# Patient Record
Sex: Female | Born: 1960 | Hispanic: No | Marital: Married | Smoking: Never smoker
Health system: Southern US, Community
[De-identification: ages and names within clinical notes are randomized; demographics above are authoritative.]

## PROBLEM LIST (undated history)

## (undated) DIAGNOSIS — R42 Dizziness and giddiness: Secondary | ICD-10-CM

## (undated) DIAGNOSIS — G43909 Migraine, unspecified, not intractable, without status migrainosus: Secondary | ICD-10-CM

## (undated) HISTORY — PX: COLONOSCOPY: SHX174

## (undated) HISTORY — DX: Dizziness and giddiness: R42

## (undated) HISTORY — DX: Migraine, unspecified, not intractable, without status migrainosus: G43.909

---

## 2000-02-24 ENCOUNTER — Other Ambulatory Visit: Admission: RE | Admit: 2000-02-24 | Discharge: 2000-02-24 | Payer: Self-pay | Admitting: Gynecology

## 2001-02-28 ENCOUNTER — Other Ambulatory Visit: Admission: RE | Admit: 2001-02-28 | Discharge: 2001-02-28 | Payer: Self-pay | Admitting: Gynecology

## 2002-02-28 ENCOUNTER — Other Ambulatory Visit: Admission: RE | Admit: 2002-02-28 | Discharge: 2002-02-28 | Payer: Self-pay | Admitting: Gynecology

## 2003-03-16 ENCOUNTER — Other Ambulatory Visit: Admission: RE | Admit: 2003-03-16 | Discharge: 2003-03-16 | Payer: Self-pay | Admitting: Gynecology

## 2004-03-16 ENCOUNTER — Other Ambulatory Visit: Admission: RE | Admit: 2004-03-16 | Discharge: 2004-03-16 | Payer: Self-pay | Admitting: Gynecology

## 2005-04-14 ENCOUNTER — Other Ambulatory Visit: Admission: RE | Admit: 2005-04-14 | Discharge: 2005-04-14 | Payer: Self-pay | Admitting: Gynecology

## 2006-05-21 ENCOUNTER — Other Ambulatory Visit: Admission: RE | Admit: 2006-05-21 | Discharge: 2006-05-21 | Payer: Self-pay | Admitting: Gynecology

## 2006-06-20 ENCOUNTER — Emergency Department: Payer: Self-pay | Admitting: Emergency Medicine

## 2007-05-31 ENCOUNTER — Other Ambulatory Visit: Admission: RE | Admit: 2007-05-31 | Discharge: 2007-05-31 | Payer: Self-pay | Admitting: Gynecology

## 2009-02-05 ENCOUNTER — Other Ambulatory Visit: Admission: RE | Admit: 2009-02-05 | Discharge: 2009-02-05 | Payer: Self-pay | Admitting: Gynecology

## 2009-02-05 ENCOUNTER — Ambulatory Visit: Payer: Self-pay | Admitting: Women's Health

## 2009-02-05 ENCOUNTER — Encounter: Payer: Self-pay | Admitting: Women's Health

## 2010-05-02 ENCOUNTER — Ambulatory Visit
Admission: RE | Admit: 2010-05-02 | Discharge: 2010-05-02 | Payer: Self-pay | Source: Home / Self Care | Attending: Women's Health | Admitting: Women's Health

## 2010-05-02 ENCOUNTER — Other Ambulatory Visit: Payer: Self-pay | Admitting: Women's Health

## 2010-05-02 ENCOUNTER — Other Ambulatory Visit
Admission: RE | Admit: 2010-05-02 | Discharge: 2010-05-02 | Payer: Self-pay | Source: Home / Self Care | Admitting: Gynecology

## 2010-11-15 ENCOUNTER — Encounter: Payer: Self-pay | Admitting: Anesthesiology

## 2010-11-16 ENCOUNTER — Ambulatory Visit (INDEPENDENT_AMBULATORY_CARE_PROVIDER_SITE_OTHER): Payer: BC Managed Care – PPO | Admitting: Women's Health

## 2010-11-16 ENCOUNTER — Encounter: Payer: Self-pay | Admitting: Women's Health

## 2010-11-16 VITALS — BP 130/70

## 2010-11-16 DIAGNOSIS — N898 Other specified noninflammatory disorders of vagina: Secondary | ICD-10-CM

## 2010-11-16 DIAGNOSIS — R300 Dysuria: Secondary | ICD-10-CM

## 2010-11-16 DIAGNOSIS — F411 Generalized anxiety disorder: Secondary | ICD-10-CM

## 2010-11-16 DIAGNOSIS — R82998 Other abnormal findings in urine: Secondary | ICD-10-CM

## 2010-11-16 DIAGNOSIS — F419 Anxiety disorder, unspecified: Secondary | ICD-10-CM

## 2010-11-16 DIAGNOSIS — Z01419 Encounter for gynecological examination (general) (routine) without abnormal findings: Secondary | ICD-10-CM

## 2010-11-16 DIAGNOSIS — Z13220 Encounter for screening for lipoid disorders: Secondary | ICD-10-CM

## 2010-11-16 DIAGNOSIS — Z309 Encounter for contraceptive management, unspecified: Secondary | ICD-10-CM

## 2010-11-16 MED ORDER — ETONOGESTREL-ETHINYL ESTRADIOL 0.12-0.015 MG/24HR VA RING: VAGINAL_RING | VAGINAL | Status: DC

## 2010-11-16 MED ORDER — ALPRAZOLAM 0.5 MG PO TABS: 0.5000 mg | ORAL_TABLET | Freq: Every evening | ORAL | Status: AC | PRN

## 2010-11-16 MED ORDER — FLUCONAZOLE 150 MG PO TABS: 150.0000 mg | ORAL_TABLET | Freq: Once | ORAL | Status: DC

## 2010-11-16 MED ORDER — SERTRALINE HCL 50 MG PO TABS: 50.0000 mg | ORAL_TABLET | Freq: Every day | ORAL | Status: DC

## 2010-11-16 NOTE — Progress Notes (Deleted)
Alexandria Wheeler 12/01/1960 [id number]    History:    The patient presents for annual exam.  50 yo MWF G0 contraceptives, NuvaRing continuously, having no cycle. Has had some increased vaginal irritation and slight burning with urination at the initiation of the stream. Has a history of migraines, has seen a headache specialist and she is currently on Topamax, treximet per neurologist. Has a history of anxiety and  depression in2004. States has done well since then, but has had increased anxiety and depression due to situational stressors. Had been on Zoloft at that time and had good relief of symptoms. And would like to go back on Zoloft. Denies any feelings of harming self.   Past medical history, past surgical history, family history and social history were all reviewed and documented in the EPIC chart.   ROS:  A 14 point ROS was performed and pertinent positives and negatives are included in the history.  Exam:  Filed Vitals:   11/16/10 1049  BP: 130/70    General appearance:  Normal Head/Neck:  Normal, without cervical or supraclavicular adenopathy. Thyroid:  Symmetrical, normal in size, without palpable masses or nodularity. Respiratory  Effort:  Normal  Auscultation:  Clear without wheezing or rhonchi Cardiovascular  Auscultation:  Regular rate, without rubs, murmurs or gallops  Edema/varicosities:  Not grossly evident Abdominal  Masses/tenderness:  Soft,nontender, without masses, guarding or rebound.  Liver/spleen:  No organomegaly noted  Hernia:  None appreciated  Occult test:   Skin  Inspection:  Grossly normal  Palpation:  Grossly normal Neurologic/psychiatric  Orientation:  Normal with appropriate conversation.  Mood/affect:  Normal  Genitourinary    Breasts: Examined lying and sitting.     Right: Without masses, retractions, discharge or axillary adenopathy.     Left: Without masses, retractions, discharge or axillary adenopathy.   Inguinal/mons:  Normal without  inguinal adenopathy  External genitalia:  Normal  BUS/Urethra/Skene's glands:  Normal  Bladder:  Normal  Vagina:  Normal  Cervix:  Normal  Uterus:   normal in size, shape and contour.  Midline and mobile  Adnexa/parametria:     Rt: Without masses or tenderness.   Lt: Without masses or tenderness.  Anus and perineum: Normal  Digital rectal exam: Normal sphincter tone without palpated masses or tenderness  Assessment/Plan:  50 y.o. year old female for annual exam.  Contraceptive with NuvaRing, uses continuously and has been cycle free for approximately one year. Prescription proper use was given and reviewed, reviewed slight risk for blood clots and strokes and accepts. Will check a CBC, lipid profile, UA, and Pap. SBE's encouraged, she does get a yearly mammogram which have been normal also. Encouraged exercise, vitamin D 2000 daily encouraged. Will start back on Zoloft 50 mg, start with a half a tablet for several weeks increased to a full tablet. Strongly encouraged to return to counseling. She does live in [place] and she has seen counselor there. She had a normal colonoscopy in 2010, does have a history of a negative polyp in1995. Does use occasional Xanax 0.5 did review addictive properties, prescription was given.    Harrington Challenger MD, 12:15 PM 11/16/2010

## 2010-11-16 NOTE — Progress Notes (Signed)
  Presents with several complaints. States has had some increase in vaginal discharge, vaginal burning, and burning at initiation of stream of urination. Has had some increased situational stressors. Increased stress at work, been more tearful  lately, has had a history of anxiety in 2004. Has used Zoloft in the past with good relief but states has not needed  since 2004. Does take an occasional Xanax 0.5. Denies any feelings of harming herself, but states she feels that Zoloft would be helpful to take again. Encourage counseling she has sought counseling in the past and will call to schedule. She does live in [place] and she will get that scheduled.  External genitalia is erythematous at introitus, speculum exam scant amount of white discharge but vaginal walls were also erythematous. wet prep is positive for yeast. Bimanual no CMT or adnexal fullness or tenderness. Will treat with Diflucan 150 by mouth x1 dose with a refill.  Yeast prevention discussed.  We'll start on Zoloft 50 will take a half a tablet for 2-3 weeks,.and then a full tablet after that. Will discuss medication with her counselor,  and call if changes are needed. Reviewed importance of exercise in relationship to stress, leisure activities, and adequate rest.

## 2011-01-17 ENCOUNTER — Encounter: Payer: Self-pay | Admitting: *Deleted

## 2011-03-23 ENCOUNTER — Other Ambulatory Visit: Payer: Self-pay | Admitting: Women's Health

## 2011-04-11 HISTORY — PX: BREAST BIOPSY: SHX20

## 2011-04-18 ENCOUNTER — Encounter: Payer: Self-pay | Admitting: Women's Health

## 2011-04-20 ENCOUNTER — Encounter: Payer: Self-pay | Admitting: Women's Health

## 2011-06-05 ENCOUNTER — Encounter: Payer: Self-pay | Admitting: Women's Health

## 2011-06-05 ENCOUNTER — Ambulatory Visit (INDEPENDENT_AMBULATORY_CARE_PROVIDER_SITE_OTHER): Payer: BC Managed Care – PPO | Admitting: Women's Health

## 2011-06-05 VITALS — BP 130/80 | Ht 66.25 in | Wt 158.0 lb

## 2011-06-05 DIAGNOSIS — Z833 Family history of diabetes mellitus: Secondary | ICD-10-CM

## 2011-06-05 DIAGNOSIS — Z13220 Encounter for screening for lipoid disorders: Secondary | ICD-10-CM

## 2011-06-05 DIAGNOSIS — Z01419 Encounter for gynecological examination (general) (routine) without abnormal findings: Secondary | ICD-10-CM

## 2011-06-05 DIAGNOSIS — G43909 Migraine, unspecified, not intractable, without status migrainosus: Secondary | ICD-10-CM

## 2011-06-05 DIAGNOSIS — J302 Other seasonal allergic rhinitis: Secondary | ICD-10-CM

## 2011-06-05 DIAGNOSIS — J309 Allergic rhinitis, unspecified: Secondary | ICD-10-CM

## 2011-06-05 LAB — LIPID PANEL
HDL: 67 mg/dL (ref >39)
LDL Cholesterol: 135 mg/dL — ABNORMAL HIGH (ref 0–99)
Total CHOL/HDL Ratio: 3.3 Ratio

## 2011-06-05 LAB — GLUCOSE, RANDOM: Glucose, Bld: 119 mg/dL — ABNORMAL HIGH (ref 70–99)

## 2011-06-05 LAB — CBC WITH DIFFERENTIAL/PLATELET
Basophils Relative: 0 % (ref 0–1)
Eosinophils Absolute: 0.1 10*3/uL (ref 0.0–0.7)
Eosinophils Relative: 1 % (ref 0–5)
Lymphs Abs: 1.2 10*3/uL (ref 0.7–4.0)
MCH: 28.9 pg (ref 26.0–34.0)
MCHC: 32.6 g/dL (ref 30.0–36.0)
MCV: 88.8 fL (ref 78.0–100.0)
Monocytes Relative: 0 % — ABNORMAL LOW (ref 3–12)
Neutrophils Relative %: 89 % — ABNORMAL HIGH (ref 43–77)
Platelets: 328 10*3/uL (ref 150–400)
RBC: 4.56 MIL/uL (ref 3.87–5.11)

## 2011-06-05 MED ORDER — CETIRIZINE HCL 10 MG PO TABS: 10.0000 mg | ORAL_TABLET | Freq: Every day | ORAL | Status: DC

## 2011-06-05 MED ORDER — PSEUDOEPHEDRINE HCL ER 120 MG PO TB12: 120.0000 mg | ORAL_TABLET | Freq: Two times a day (BID) | ORAL | Status: DC

## 2011-06-05 NOTE — Progress Notes (Signed)
Alexandria Wheeler 1960-10-19 [id number]    History:    The patient presents for annual exam.  Rare cycle on nuva ring. Continues with headaches that are less frequent, occasional problems with anxiety using no medication. Last colonoscopy was in 2010 negative. History of normal Paps. Mammogram in December showed questionable area with a negative biopsy.   Past medical history, past surgical history, family history and social history were all reviewed and documented in the EPIC chart. Works at [employer].   ROS:  A  ROS was performed and pertinent positives and negatives are included in the history.  Exam:  Filed Vitals:   06/05/11 1105  BP: 130/80    General appearance:  Normal Head/Neck:  Normal, without cervical or supraclavicular adenopathy. Thyroid:  Symmetrical, normal in size, without palpable masses or nodularity. Respiratory  Effort:  Normal  Auscultation:  Clear without wheezing or rhonchi Cardiovascular  Auscultation:  Regular rate, without rubs, murmurs or gallops  Edema/varicosities:  Not grossly evident Abdominal  Soft,nontender, without masses, guarding or rebound.  Liver/spleen:  No organomegaly noted  Hernia:  None appreciated  Skin  Inspection:  Grossly normal  Palpation:  Grossly normal Neurologic/psychiatric  Orientation:  Normal with appropriate conversation.  Mood/affect:  Normal  Genitourinary    Breasts: Examined lying and sitting.     Right: Without masses, retractions, discharge or axillary adenopathy.     Left: Without masses, retractions, discharge or axillary adenopathy.   Inguinal/mons:  Normal without inguinal adenopathy  External genitalia:  Normal  BUS/Urethra/Skene's glands:  Normal  Bladder:  Normal  Vagina:  Normal  Cervix:  Normal  Uterus:   normal in size, shape and contour.  Midline and mobile  Adnexa/parametria:     Rt: Without masses or tenderness.   Lt: Without masses or tenderness.  Anus and perineum: Normal  Digital rectal  exam: Normal sphincter tone without palpated masses or tenderness  Assessment/Plan:  51 y.o. MWF G0 for annual exam.    Migraines without aura-neurologist History of anxiety-no medications Negative left breast biopsy January 2013 History of benign colon polyp- 89 with a negative colonoscopy in 2010  Plan: Nuva ring prescription, proper use, slight risk for blood clots and strokes. Instructed to use continuously for 4 weeks, remove, replace a new nuva ring. States has an occasional hot flash but is basically amenorrheic, and may have less headaches ring free week. SBE's, mammogram in 6 months, exercise, calcium rich diet encouraged. CBC, glucose, lipid profile, UA.    Harrington Challenger WHNP, 12:40 PM 06/05/2011

## 2011-06-06 LAB — URINALYSIS W MICROSCOPIC + REFLEX CULTURE
Hgb urine dipstick: NEGATIVE
Leukocytes, UA: NEGATIVE
Nitrite: NEGATIVE
Protein, ur: NEGATIVE mg/dL
Squamous Epithelial / LPF: NONE SEEN

## 2011-07-07 ENCOUNTER — Other Ambulatory Visit: Payer: Self-pay | Admitting: Women's Health

## 2011-12-26 ENCOUNTER — Other Ambulatory Visit: Payer: Self-pay | Admitting: Women's Health

## 2011-12-26 NOTE — Telephone Encounter (Signed)
Called the denial into pharmacy and told them to let her know we recommend an office visit to assess the needs for this Rx.

## 2012-01-30 ENCOUNTER — Other Ambulatory Visit: Payer: Self-pay | Admitting: Women's Health

## 2012-02-11 ENCOUNTER — Other Ambulatory Visit: Payer: Self-pay | Admitting: Women's Health

## 2012-03-11 ENCOUNTER — Emergency Department: Payer: Self-pay | Admitting: Emergency Medicine

## 2012-03-11 LAB — CBC
HCT: 40.9 % (ref 35.0–47.0)
HGB: 13.4 g/dL (ref 12.0–16.0)
MCH: 29.4 pg (ref 26.0–34.0)
RBC: 4.57 10*6/uL (ref 3.80–5.20)
WBC: 11.4 10*3/uL — ABNORMAL HIGH (ref 3.6–11.0)

## 2012-03-11 LAB — BASIC METABOLIC PANEL
Calcium, Total: 8.2 mg/dL — ABNORMAL LOW (ref 8.5–10.1)
Co2: 19 mmol/L — ABNORMAL LOW (ref 21–32)
EGFR (Non-African Amer.): >60
Glucose: 131 mg/dL — ABNORMAL HIGH (ref 65–99)
Osmolality: 278 (ref 275–301)
Potassium: 4 mmol/L (ref 3.5–5.1)
Sodium: 138 mmol/L (ref 136–145)

## 2012-03-11 LAB — TROPONIN I: Troponin-I: <0.02 ng/mL

## 2012-03-11 LAB — CK TOTAL AND CKMB (NOT AT ARMC): CK-MB: 0.7 ng/mL (ref 0.5–3.6)

## 2012-03-22 ENCOUNTER — Telehealth: Payer: Self-pay | Admitting: Women's Health

## 2012-03-22 NOTE — Telephone Encounter (Signed)
Telephone call to [facility], she had faxed lab results that she had obtained during a clinical trial for IBS at [facility]. Reviewed urine was normal,  CBC platelets were slightly elevated at 429 and white count was elevated at 15.89. Reviewed we would repeat a CBC at annual exam in February. Reviewed CK slightly elevated will repeat a comprehensive metabolic also.

## 2012-04-17 ENCOUNTER — Encounter: Payer: Self-pay | Admitting: Gynecology

## 2012-06-05 ENCOUNTER — Encounter: Payer: BC Managed Care – PPO | Admitting: Women's Health

## 2012-06-19 ENCOUNTER — Encounter: Payer: Self-pay | Admitting: Women's Health

## 2012-06-19 ENCOUNTER — Ambulatory Visit (INDEPENDENT_AMBULATORY_CARE_PROVIDER_SITE_OTHER): Payer: BC Managed Care – PPO | Admitting: Women's Health

## 2012-06-19 VITALS — BP 114/74 | Ht 66.25 in | Wt 163.5 lb

## 2012-06-19 DIAGNOSIS — J309 Allergic rhinitis, unspecified: Secondary | ICD-10-CM

## 2012-06-19 DIAGNOSIS — Z889 Allergy status to unspecified drugs, medicaments and biological substances status: Secondary | ICD-10-CM

## 2012-06-19 DIAGNOSIS — B3731 Acute candidiasis of vulva and vagina: Secondary | ICD-10-CM

## 2012-06-19 DIAGNOSIS — Z13220 Encounter for screening for lipoid disorders: Secondary | ICD-10-CM

## 2012-06-19 DIAGNOSIS — IMO0001 Reserved for inherently not codable concepts without codable children: Secondary | ICD-10-CM

## 2012-06-19 DIAGNOSIS — Z833 Family history of diabetes mellitus: Secondary | ICD-10-CM

## 2012-06-19 DIAGNOSIS — F411 Generalized anxiety disorder: Secondary | ICD-10-CM | POA: Insufficient documentation

## 2012-06-19 DIAGNOSIS — Z309 Encounter for contraceptive management, unspecified: Secondary | ICD-10-CM

## 2012-06-19 DIAGNOSIS — Z9109 Other allergy status, other than to drugs and biological substances: Secondary | ICD-10-CM

## 2012-06-19 DIAGNOSIS — J302 Other seasonal allergic rhinitis: Secondary | ICD-10-CM

## 2012-06-19 DIAGNOSIS — F4323 Adjustment disorder with mixed anxiety and depressed mood: Secondary | ICD-10-CM

## 2012-06-19 DIAGNOSIS — B373 Candidiasis of vulva and vagina: Secondary | ICD-10-CM

## 2012-06-19 DIAGNOSIS — Z01419 Encounter for gynecological examination (general) (routine) without abnormal findings: Secondary | ICD-10-CM

## 2012-06-19 LAB — CBC WITH DIFFERENTIAL/PLATELET
Basophils Absolute: 0.1 K/uL (ref 0.0–0.1)
Basophils Relative: 1 % (ref 0–1)
Eosinophils Absolute: 0.4 K/uL (ref 0.0–0.7)
Eosinophils Relative: 4 % (ref 0–5)
HCT: 39.3 % (ref 36.0–46.0)
Hemoglobin: 13.4 g/dL (ref 12.0–15.0)
Lymphocytes Relative: 33 % (ref 12–46)
Lymphs Abs: 3.6 K/uL (ref 0.7–4.0)
MCH: 29.4 pg (ref 26.0–34.0)
MCHC: 34.1 g/dL (ref 30.0–36.0)
MCV: 86.2 fL (ref 78.0–100.0)
Monocytes Absolute: 0.7 K/uL (ref 0.1–1.0)
Monocytes Relative: 6 % (ref 3–12)
Neutro Abs: 6.3 K/uL (ref 1.7–7.7)
Neutrophils Relative %: 56 % (ref 43–77)
Platelets: 351 K/uL (ref 150–400)
RBC: 4.56 MIL/uL (ref 3.87–5.11)
RDW: 13.2 % (ref 11.5–15.5)
WBC: 11.1 K/uL — ABNORMAL HIGH (ref 4.0–10.5)

## 2012-06-19 LAB — WET PREP FOR TRICH, YEAST, CLUE: Trich, Wet Prep: NONE SEEN

## 2012-06-19 LAB — LIPID PANEL
HDL: 55 mg/dL (ref >39)
LDL Cholesterol: 125 mg/dL — ABNORMAL HIGH (ref 0–99)
Total CHOL/HDL Ratio: 4 Ratio

## 2012-06-19 LAB — GLUCOSE, RANDOM: Glucose, Bld: 87 mg/dL (ref 70–99)

## 2012-06-19 MED ORDER — PSEUDOEPHEDRINE HCL ER 120 MG PO TB12: 120.0000 mg | ORAL_TABLET | Freq: Two times a day (BID) | ORAL | Status: DC

## 2012-06-19 MED ORDER — FLUCONAZOLE 150 MG PO TABS: 150.0000 mg | ORAL_TABLET | Freq: Once | ORAL | Status: DC

## 2012-06-19 MED ORDER — CETIRIZINE HCL 10 MG PO TABS: 10.0000 mg | ORAL_TABLET | Freq: Every day | ORAL | Status: DC

## 2012-06-19 MED ORDER — SERTRALINE HCL 50 MG PO TABS: 50.0000 mg | ORAL_TABLET | Freq: Every day | ORAL | Status: DC

## 2012-06-19 MED ORDER — ETONOGESTREL-ETHINYL ESTRADIOL 0.12-0.015 MG/24HR VA RING: 1.0000 | VAGINAL_RING | VAGINAL | Status: DC

## 2012-06-19 NOTE — Progress Notes (Signed)
Alexandria Wheeler May 02, 1960 [id number]    History:    The patient presents for annual exam.  NuvaRing continuously for migraine prevention. Rare spotting. History of normal Paps. Had a benign breast biopsy January 2013, normal mammogram after. History of anxiety on Zoloft, has been off in the past but would become more anxious. Colonoscopy with benign polyps 95, 2000, 2005, 2010. Environmental allergies-Zyrtec and Sudafed prn.   Past medical history, past surgical history, family history and social history were all reviewed and documented in the EPIC chart. Stepdaughter Kellogg doing well. Works at [employer] in [employer].   ROS:  A  ROS was performed and pertinent positives and negatives are included in the history.  Exam:  Filed Vitals:   06/19/12 1504  BP: 114/74    General appearance:  Normal Head/Neck:  Normal, without cervical or supraclavicular adenopathy. Thyroid:  Symmetrical, normal in size, without palpable masses or nodularity. Respiratory  Effort:  Normal  Auscultation:  Clear without wheezing or rhonchi Cardiovascular  Auscultation:  Regular rate, without rubs, murmurs or gallops  Edema/varicosities:  Not grossly evident Abdominal  Soft,nontender, without masses, guarding or rebound.  Liver/spleen:  No organomegaly noted  Hernia:  None appreciated  Skin  Inspection:  Grossly normal  Palpation:  Grossly normal Neurologic/psychiatric  Orientation:  Normal with appropriate conversation.  Mood/affect:  Normal  Genitourinary    Breasts: Examined lying and sitting.     Right: Without masses, retractions, discharge or axillary adenopathy.     Left: Without masses, retractions, discharge or axillary adenopathy.   Inguinal/mons:  Normal without inguinal adenopathy  External genitalia:  Normal  BUS/Urethra/Skene's glands:  Normal  Bladder:  Normal  Vagina:  Wet prep positive for yeast  Cervix:  Normal  Uterus:   normal in size, shape  and contour.  Midline and mobile  Adnexa/parametria:     Rt: Without masses or tenderness.   Lt: Without masses or tenderness.  Anus and perineum: Normal  Digital rectal exam: Normal sphincter tone without palpated masses or tenderness  Assessment/Plan:  52 y.o. M. WF G0  for annual exam with complaint of vaginal itching  NuvaRing continuously Migraines Dr. Neale Burly History of benign colon polyps Anxiety-stable on Zoloft 50 Yeast vaginitis  Plan: NuvaRing prescription, proper use, slight risk for blood clots and strokes reviewed. Check FSH at next annual exam at placebo week. Diflucan 150 by mouth times one dose with refill. SBE's, continue annual mammogram, calcium rich diet, continue regular exercise. Zoloft 50 mg prescription, proper use given and reviewed. Declines need for counseling at this time. CBC, glucose, lipid panel, TSH, UA Pap normal 05/2010, reviewed new screening guidelines. Continue followup with gastroenterologist, repeat colonoscopy next year.    Harrington Challenger [facility], 5:18 PM 06/19/2012

## 2012-06-19 NOTE — Patient Instructions (Signed)

## 2012-06-20 ENCOUNTER — Encounter: Payer: Self-pay | Admitting: *Deleted

## 2012-06-20 LAB — URINALYSIS W MICROSCOPIC + REFLEX CULTURE
Bacteria, UA: NONE SEEN
Bilirubin Urine: NEGATIVE
Ketones, ur: NEGATIVE mg/dL
Specific Gravity, Urine: 1.029 (ref 1.005–1.030)
Urobilinogen, UA: 0.2 mg/dL (ref 0.0–1.0)

## 2012-06-20 LAB — URINE CULTURE: Colony Count: NO GROWTH

## 2012-06-20 LAB — TSH: TSH: 2.586 u[IU]/mL (ref 0.350–4.500)

## 2012-06-20 NOTE — Progress Notes (Signed)
Patient ID: Alexandria Wheeler, female   DOB: 14-May-1960, 52 y.o.   MRN: [id number] rx for zyrtec 10 mg and sudafed 120 mg mailed to pt per request.

## 2012-06-21 ENCOUNTER — Other Ambulatory Visit: Payer: Self-pay | Admitting: Women's Health

## 2012-06-21 DIAGNOSIS — E7800 Pure hypercholesterolemia, unspecified: Secondary | ICD-10-CM

## 2013-07-22 ENCOUNTER — Other Ambulatory Visit: Payer: Self-pay | Admitting: Women's Health

## 2013-07-24 ENCOUNTER — Encounter: Payer: Self-pay | Admitting: Women's Health

## 2013-07-24 ENCOUNTER — Other Ambulatory Visit (HOSPITAL_COMMUNITY)
Admission: RE | Admit: 2013-07-24 | Discharge: 2013-07-24 | Disposition: A | Discharge status: Home / Self Care | Payer: BC Managed Care – PPO | Source: Ambulatory Visit | Attending: Women's Health | Admitting: Women's Health

## 2013-07-24 ENCOUNTER — Ambulatory Visit (INDEPENDENT_AMBULATORY_CARE_PROVIDER_SITE_OTHER): Payer: BC Managed Care – PPO | Admitting: Women's Health

## 2013-07-24 VITALS — BP 112/70 | Ht 66.25 in | Wt 180.6 lb

## 2013-07-24 DIAGNOSIS — F3289 Other specified depressive episodes: Secondary | ICD-10-CM

## 2013-07-24 DIAGNOSIS — J309 Allergic rhinitis, unspecified: Secondary | ICD-10-CM

## 2013-07-24 DIAGNOSIS — Z9109 Other allergy status, other than to drugs and biological substances: Secondary | ICD-10-CM

## 2013-07-24 DIAGNOSIS — Z889 Allergy status to unspecified drugs, medicaments and biological substances status: Secondary | ICD-10-CM

## 2013-07-24 DIAGNOSIS — Z860100 Personal history of colon polyps, unspecified: Secondary | ICD-10-CM | POA: Insufficient documentation

## 2013-07-24 DIAGNOSIS — B373 Candidiasis of vulva and vagina: Secondary | ICD-10-CM

## 2013-07-24 DIAGNOSIS — Z9889 Other specified postprocedural states: Secondary | ICD-10-CM

## 2013-07-24 DIAGNOSIS — Z309 Encounter for contraceptive management, unspecified: Secondary | ICD-10-CM

## 2013-07-24 DIAGNOSIS — Z1151 Encounter for screening for human papillomavirus (HPV): Secondary | ICD-10-CM | POA: Insufficient documentation

## 2013-07-24 DIAGNOSIS — IMO0001 Reserved for inherently not codable concepts without codable children: Secondary | ICD-10-CM

## 2013-07-24 DIAGNOSIS — J302 Other seasonal allergic rhinitis: Secondary | ICD-10-CM

## 2013-07-24 DIAGNOSIS — Z01419 Encounter for gynecological examination (general) (routine) without abnormal findings: Secondary | ICD-10-CM

## 2013-07-24 DIAGNOSIS — F32A Depression, unspecified: Secondary | ICD-10-CM

## 2013-07-24 DIAGNOSIS — Z13220 Encounter for screening for lipoid disorders: Secondary | ICD-10-CM

## 2013-07-24 DIAGNOSIS — F329 Major depressive disorder, single episode, unspecified: Secondary | ICD-10-CM

## 2013-07-24 DIAGNOSIS — B3731 Acute candidiasis of vulva and vagina: Secondary | ICD-10-CM

## 2013-07-24 LAB — CBC WITH DIFFERENTIAL/PLATELET
BASOS PCT: 1 % (ref 0–1)
Basophils Absolute: 0.1 10*3/uL (ref 0.0–0.1)
EOS ABS: 0.5 10*3/uL (ref 0.0–0.7)
Eosinophils Relative: 4 % (ref 0–5)
HEMATOCRIT: 40.6 % (ref 36.0–46.0)
Hemoglobin: 14 g/dL (ref 12.0–15.0)
Lymphocytes Relative: 22 % (ref 12–46)
Lymphs Abs: 2.9 10*3/uL (ref 0.7–4.0)
MCH: 29.2 pg (ref 26.0–34.0)
MCHC: 34.5 g/dL (ref 30.0–36.0)
MCV: 84.6 fL (ref 78.0–100.0)
MONO ABS: 0.7 10*3/uL (ref 0.1–1.0)
Monocytes Relative: 5 % (ref 3–12)
Neutro Abs: 9.1 10*3/uL — ABNORMAL HIGH (ref 1.7–7.7)
Neutrophils Relative %: 68 % (ref 43–77)
Platelets: 332 10*3/uL (ref 150–400)
RBC: 4.8 MIL/uL (ref 3.87–5.11)
RDW: 13.1 % (ref 11.5–15.5)
WBC: 13.4 10*3/uL — AB (ref 4.0–10.5)

## 2013-07-24 LAB — LIPID PANEL
Cholesterol: 211 mg/dL — ABNORMAL HIGH (ref 0–200)
HDL: 64 mg/dL (ref >39)
LDL Cholesterol: 125 mg/dL — ABNORMAL HIGH (ref 0–99)
TRIGLYCERIDES: 109 mg/dL (ref <150)
Total CHOL/HDL Ratio: 3.3 Ratio
VLDL: 22 mg/dL (ref 0–40)

## 2013-07-24 MED ORDER — ETONOGESTREL-ETHINYL ESTRADIOL 0.12-0.015 MG/24HR VA RING: 1.0000 | VAGINAL_RING | VAGINAL | Status: DC

## 2013-07-24 MED ORDER — FLUCONAZOLE 150 MG PO TABS: 150.0000 mg | ORAL_TABLET | Freq: Once | ORAL | Status: DC

## 2013-07-24 MED ORDER — CETIRIZINE HCL 10 MG PO TABS: 10.0000 mg | ORAL_TABLET | Freq: Every day | ORAL | Status: DC

## 2013-07-24 MED ORDER — SERTRALINE HCL 50 MG PO TABS: ORAL_TABLET | ORAL | Status: DC

## 2013-07-24 MED ORDER — PSEUDOEPHEDRINE HCL ER 120 MG PO TB12: 120.0000 mg | ORAL_TABLET | Freq: Two times a day (BID) | ORAL | Status: DC

## 2013-07-24 MED ORDER — ALPRAZOLAM 0.25 MG PO TABS: 0.2500 mg | ORAL_TABLET | Freq: Every evening | ORAL | Status: DC | PRN

## 2013-07-24 NOTE — Progress Notes (Signed)
Alexandria GriceMelissa Wheeler 02/22/1961 [id number]    History:    Presents for annual exam.  Rare bleeding  NuvaRing continuously. Normal Pap  History. Benign breast biopsy 04/2011 with normal mammograms after. Anxiety stable on Zoloft. Benign polyps on colonoscopy 1995, 2000, 2005, 2010, has one scheduled 12/2013. Paternal uncle died of colon cancer.  Past medical history, past surgical history, family history and social history were all reviewed and documented in the EPIC chart. 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  ROS:  A  ROS was performed and pertinent positives and negatives are included.  Exam:  Filed Vitals:   07/24/13 1215  BP: 112/70    General appearance:  Normal Thyroid:  Symmetrical, normal in size, without palpable masses or nodularity. Respiratory  Auscultation:  Clear without wheezing or rhonchi Cardiovascular  Auscultation:  Regular rate, without rubs, murmurs or gallops  Edema/varicosities:  Not grossly evident Abdominal  Soft,nontender, without masses, guarding or rebound.  Liver/spleen:  No organomegaly noted  Hernia:  None appreciated  Skin  Inspection:  Grossly normal   Breasts: Examined lying and sitting.     Right: Without masses, retractions, discharge or axillary adenopathy.     Left: Without masses, retractions, discharge or axillary adenopathy. Gentitourinary   Inguinal/mons:  Normal without inguinal adenopathy  External genitalia:  Normal  BUS/Urethra/Skene's glands:  Normal  Vagina:  Normal  Cervix:  Normal  Uterus:   normal in size, shape and contour.  Midline and mobile  Adnexa/parametria:     Rt: Without masses or tenderness.   Lt: Without masses or tenderness.  Anus and perineum: Normal  Digital rectal exam: Normal sphincter tone without palpated masses or tenderness  Assessment/Plan:  53 y.o. MWF G0 for annual exam.     NuvaRing continuously Anxiety stable on Zoloft Seasonal  allergies stable with Zyrtec, Sudafed Benign breast biopsy 2013 Numerous benign colon polyps followup scheduled 12/2013  Plan: SBE's, continue annual screening mammogram, calcium rich diet, vitamin D 1000 daily. NuvaRing prescription, proper use, slight risk for blood clots and strokes reviewed. Instructed to be off NuvaRing for 1 week at annual exam next year [facility]. Zoloft 50 mg by mouth daily prescription, proper use given and reviewed. Has had counseling in the past denies need at this time. Uses occasional Xanax .25 for rest, uses less than one prescription per year, prescription given is aware of addictive properties and uses rarely. CBC, lipid panel, UA, Pap with HR HPV typing. Pap normal 2012, new screening guidelines reviewed.     Harrington Challengerancy J Dahlila Wheeler [facility] IncWHNP, 5:04 PM 07/24/2013

## 2013-07-24 NOTE — Patient Instructions (Signed)

## 2013-07-25 ENCOUNTER — Encounter: Payer: Self-pay | Admitting: Women's Health

## 2013-07-25 LAB — URINALYSIS W MICROSCOPIC + REFLEX CULTURE
BILIRUBIN URINE: NEGATIVE
CRYSTALS: NONE SEEN
Casts: NONE SEEN
Glucose, UA: NEGATIVE mg/dL
HGB URINE DIPSTICK: NEGATIVE
Leukocytes, UA: NEGATIVE
NITRITE: NEGATIVE
PROTEIN: NEGATIVE mg/dL
SPECIFIC GRAVITY, URINE: 1.022 (ref 1.005–1.030)
UROBILINOGEN UA: 0.2 mg/dL (ref 0.0–1.0)
pH: 6.5 (ref 5.0–8.0)

## 2013-07-26 LAB — URINE CULTURE

## 2013-08-27 LAB — CBC WITH DIFFERENTIAL/PLATELET
Basophil #: 0.1 10*3/uL (ref 0.0–0.1)
Basophil %: 0.4 %
EOS ABS: 0.6 10*3/uL (ref 0.0–0.7)
Eosinophil %: 2.5 %
HCT: 44.1 % (ref 35.0–47.0)
HGB: 14.4 g/dL (ref 12.0–16.0)
Lymphocyte #: 2.7 10*3/uL (ref 1.0–3.6)
Lymphocyte %: 11.8 %
MCH: 29.4 pg (ref 26.0–34.0)
MCHC: 32.7 g/dL (ref 32.0–36.0)
MCV: 90 fL (ref 80–100)
Monocyte #: 1.2 x10 3/mm — ABNORMAL HIGH (ref 0.2–0.9)
Monocyte %: 5.3 %
NEUTROS PCT: 80 %
Neutrophil #: 18.1 10*3/uL — ABNORMAL HIGH (ref 1.4–6.5)
RBC: 4.91 10*6/uL (ref 3.80–5.20)
RDW: 13.2 % (ref 11.5–14.5)
WBC: 22.7 10*3/uL — ABNORMAL HIGH (ref 3.6–11.0)

## 2013-08-27 LAB — URINALYSIS, COMPLETE
Bilirubin,UR: NEGATIVE
Glucose,UR: NEGATIVE mg/dL (ref 0–75)
Ketone: NEGATIVE
NITRITE: NEGATIVE
PROTEIN: NEGATIVE
Ph: 5 (ref 4.5–8.0)
RBC,UR: 3 /HPF (ref 0–5)
Specific Gravity: 1.02 (ref 1.003–1.030)
Squamous Epithelial: 3
WBC UR: 12 /HPF (ref 0–5)

## 2013-08-27 LAB — HCG, QUANTITATIVE, PREGNANCY

## 2013-08-27 LAB — COMPREHENSIVE METABOLIC PANEL
ALBUMIN: 3.3 g/dL — AB (ref 3.4–5.0)
ALT: 31 U/L (ref 12–78)
AST: 30 U/L (ref 15–37)
Alkaline Phosphatase: 77 U/L
Anion Gap: 13 (ref 7–16)
BILIRUBIN TOTAL: 0.3 mg/dL (ref 0.2–1.0)
BUN: 11 mg/dL (ref 7–18)
Calcium, Total: 8.9 mg/dL (ref 8.5–10.1)
Chloride: 105 mmol/L (ref 98–107)
Co2: 20 mmol/L — ABNORMAL LOW (ref 21–32)
Creatinine: 0.63 mg/dL (ref 0.60–1.30)
EGFR (Non-African Amer.): >60
Glucose: 140 mg/dL — ABNORMAL HIGH (ref 65–99)
Osmolality: 277 (ref 275–301)
Potassium: 3.9 mmol/L (ref 3.5–5.1)
SODIUM: 138 mmol/L (ref 136–145)
TOTAL PROTEIN: 7.1 g/dL (ref 6.4–8.2)

## 2013-08-28 ENCOUNTER — Observation Stay: Payer: Self-pay | Admitting: Surgery

## 2013-08-28 LAB — CBC WITH DIFFERENTIAL/PLATELET
BASOS PCT: 0.5 %
Basophil #: 0.1 10*3/uL (ref 0.0–0.1)
EOS ABS: 0.4 10*3/uL (ref 0.0–0.7)
Eosinophil %: 2.7 %
HCT: 38 % (ref 35.0–47.0)
HGB: 12.4 g/dL (ref 12.0–16.0)
LYMPHS ABS: 1.5 10*3/uL (ref 1.0–3.6)
LYMPHS PCT: 11.4 %
MCH: 29 pg (ref 26.0–34.0)
MCHC: 32.6 g/dL (ref 32.0–36.0)
MCV: 89 fL (ref 80–100)
MONOS PCT: 6.2 %
Monocyte #: 0.8 x10 3/mm (ref 0.2–0.9)
NEUTROS ABS: 10.7 10*3/uL — AB (ref 1.4–6.5)
Neutrophil %: 79.2 %
Platelet: 212 10*3/uL (ref 150–440)
RBC: 4.27 10*6/uL (ref 3.80–5.20)
RDW: 13 % (ref 11.5–14.5)
WBC: 13.4 10*3/uL — AB (ref 3.6–11.0)

## 2013-08-29 LAB — CBC WITH DIFFERENTIAL/PLATELET
BASOS ABS: 0 10*3/uL (ref 0.0–0.1)
Basophil %: 0.4 %
Eosinophil #: 0.2 10*3/uL (ref 0.0–0.7)
Eosinophil %: 1.9 %
HCT: 39.3 % (ref 35.0–47.0)
HGB: 13 g/dL (ref 12.0–16.0)
LYMPHS ABS: 2.5 10*3/uL (ref 1.0–3.6)
LYMPHS PCT: 23.5 %
MCH: 29.5 pg (ref 26.0–34.0)
MCHC: 33 g/dL (ref 32.0–36.0)
MCV: 89 fL (ref 80–100)
Monocyte #: 0.6 x10 3/mm (ref 0.2–0.9)
Monocyte %: 5.3 %
NEUTROS ABS: 7.2 10*3/uL — AB (ref 1.4–6.5)
NEUTROS PCT: 68.9 %
Platelet: 231 10*3/uL (ref 150–440)
RBC: 4.4 10*6/uL (ref 3.80–5.20)
RDW: 13.1 % (ref 11.5–14.5)
WBC: 10.5 10*3/uL (ref 3.6–11.0)

## 2013-08-29 LAB — COMPREHENSIVE METABOLIC PANEL
ALBUMIN: 2.6 g/dL — AB (ref 3.4–5.0)
Alkaline Phosphatase: 66 U/L
Anion Gap: 9 (ref 7–16)
BUN: 5 mg/dL — AB (ref 7–18)
Bilirubin,Total: 0.3 mg/dL (ref 0.2–1.0)
CHLORIDE: 104 mmol/L (ref 98–107)
Calcium, Total: 8.2 mg/dL — ABNORMAL LOW (ref 8.5–10.1)
Co2: 24 mmol/L (ref 21–32)
Creatinine: 0.73 mg/dL (ref 0.60–1.30)
EGFR (Non-African Amer.): >60
Glucose: 156 mg/dL — ABNORMAL HIGH (ref 65–99)
Osmolality: 274 (ref 275–301)
Potassium: 3.8 mmol/L (ref 3.5–5.1)
SGOT(AST): 45 U/L — ABNORMAL HIGH (ref 15–37)
SGPT (ALT): 29 U/L (ref 12–78)
SODIUM: 137 mmol/L (ref 136–145)
Total Protein: 6.7 g/dL (ref 6.4–8.2)

## 2013-08-29 LAB — URINALYSIS, COMPLETE
BACTERIA: NONE SEEN
Bilirubin,UR: NEGATIVE
GLUCOSE, UR: NEGATIVE mg/dL (ref 0–75)
Ketone: NEGATIVE
NITRITE: NEGATIVE
PH: 6 (ref 4.5–8.0)
Protein: NEGATIVE
RBC,UR: 1 /HPF (ref 0–5)
Specific Gravity: 1.009 (ref 1.003–1.030)

## 2013-08-29 LAB — AMYLASE: Amylase: 35 U/L (ref 25–115)

## 2013-08-30 LAB — URINE CULTURE

## 2013-09-01 LAB — CULTURE, BLOOD (SINGLE)

## 2013-11-11 ENCOUNTER — Other Ambulatory Visit: Payer: Self-pay

## 2013-11-11 DIAGNOSIS — B373 Candidiasis of vulva and vagina: Secondary | ICD-10-CM

## 2013-11-11 DIAGNOSIS — B3731 Acute candidiasis of vulva and vagina: Secondary | ICD-10-CM

## 2013-11-11 MED ORDER — FLUCONAZOLE 150 MG PO TABS: 150.0000 mg | ORAL_TABLET | Freq: Once | ORAL | Status: DC

## 2013-11-11 NOTE — Telephone Encounter (Signed)
Ok to refill, OV if no relief 

## 2014-02-11 ENCOUNTER — Telehealth: Payer: Self-pay

## 2014-02-11 NOTE — Telephone Encounter (Signed)
Patient advised.

## 2014-02-11 NOTE — Telephone Encounter (Signed)
Patient called wanting to be sure that the fax of her list of immunizations made it to Maryelizabeth RowanNancy Wheeler and if she had any questions?  Patient will be at [phone] all day with exception of an appt from 1p-3p.

## 2014-02-11 NOTE — Telephone Encounter (Signed)
Form filled out and mailed to patient per request

## 2014-05-04 ENCOUNTER — Other Ambulatory Visit: Payer: Self-pay | Admitting: Women's Health

## 2014-05-04 NOTE — Telephone Encounter (Signed)
Ok to refill diflucan - OV if no relief

## 2014-06-30 ENCOUNTER — Other Ambulatory Visit: Payer: Self-pay | Admitting: Women's Health

## 2014-07-29 ENCOUNTER — Ambulatory Visit (INDEPENDENT_AMBULATORY_CARE_PROVIDER_SITE_OTHER): Payer: BC Managed Care – PPO | Admitting: Women's Health

## 2014-07-29 ENCOUNTER — Other Ambulatory Visit: Payer: Self-pay | Admitting: Women's Health

## 2014-07-29 ENCOUNTER — Encounter: Payer: Self-pay | Admitting: Women's Health

## 2014-07-29 VITALS — BP 128/74 | Ht 66.0 in | Wt 175.0 lb

## 2014-07-29 DIAGNOSIS — F32A Depression, unspecified: Secondary | ICD-10-CM

## 2014-07-29 DIAGNOSIS — Z3040 Encounter for surveillance of contraceptives, unspecified: Secondary | ICD-10-CM | POA: Diagnosis not present

## 2014-07-29 DIAGNOSIS — H8113 Benign paroxysmal vertigo, bilateral: Secondary | ICD-10-CM

## 2014-07-29 DIAGNOSIS — Z01419 Encounter for gynecological examination (general) (routine) without abnormal findings: Secondary | ICD-10-CM | POA: Diagnosis not present

## 2014-07-29 DIAGNOSIS — Z13220 Encounter for screening for lipoid disorders: Secondary | ICD-10-CM | POA: Diagnosis not present

## 2014-07-29 DIAGNOSIS — R110 Nausea: Secondary | ICD-10-CM

## 2014-07-29 DIAGNOSIS — F329 Major depressive disorder, single episode, unspecified: Secondary | ICD-10-CM | POA: Diagnosis not present

## 2014-07-29 LAB — CBC WITH DIFFERENTIAL/PLATELET
BASOS PCT: 1 % (ref 0–1)
Basophils Absolute: 0.1 10*3/uL (ref 0.0–0.1)
Eosinophils Absolute: 0.4 10*3/uL (ref 0.0–0.7)
Eosinophils Relative: 4 % (ref 0–5)
HCT: 42.5 % (ref 36.0–46.0)
Hemoglobin: 14.2 g/dL (ref 12.0–15.0)
Lymphocytes Relative: 30 % (ref 12–46)
Lymphs Abs: 3.3 10*3/uL (ref 0.7–4.0)
MCH: 29 pg (ref 26.0–34.0)
MCHC: 33.4 g/dL (ref 30.0–36.0)
MCV: 86.7 fL (ref 78.0–100.0)
MONOS PCT: 6 % (ref 3–12)
MPV: 10.2 fL (ref 8.6–12.4)
Monocytes Absolute: 0.7 10*3/uL (ref 0.1–1.0)
NEUTROS PCT: 59 % (ref 43–77)
Neutro Abs: 6.5 10*3/uL (ref 1.7–7.7)
Platelets: 327 10*3/uL (ref 150–400)
RBC: 4.9 MIL/uL (ref 3.87–5.11)
RDW: 13.2 % (ref 11.5–15.5)
WBC: 11.1 10*3/uL — ABNORMAL HIGH (ref 4.0–10.5)

## 2014-07-29 LAB — LIPID PANEL
Cholesterol: 186 mg/dL (ref 0–200)
HDL: 45 mg/dL — ABNORMAL LOW (ref >=46)
LDL CALC: 90 mg/dL (ref 0–99)
Total CHOL/HDL Ratio: 4.1 Ratio
Triglycerides: 255 mg/dL — ABNORMAL HIGH (ref <150)
VLDL: 51 mg/dL — ABNORMAL HIGH (ref 0–40)

## 2014-07-29 LAB — COMPREHENSIVE METABOLIC PANEL
ALBUMIN: 3.6 g/dL (ref 3.5–5.2)
ALT: 17 U/L (ref 0–35)
AST: 20 U/L (ref 0–37)
Alkaline Phosphatase: 68 U/L (ref 39–117)
BILIRUBIN TOTAL: 0.2 mg/dL (ref 0.2–1.2)
BUN: 13 mg/dL (ref 6–23)
CO2: 20 meq/L (ref 19–32)
Calcium: 9.5 mg/dL (ref 8.4–10.5)
Chloride: 103 mEq/L (ref 96–112)
Creat: 0.56 mg/dL (ref 0.50–1.10)
GLUCOSE: 231 mg/dL — AB (ref 70–99)
POTASSIUM: 4.2 meq/L (ref 3.5–5.3)
SODIUM: 136 meq/L (ref 135–145)
TOTAL PROTEIN: 6.6 g/dL (ref 6.0–8.3)

## 2014-07-29 LAB — TSH: TSH: 1.732 u[IU]/mL (ref 0.350–4.500)

## 2014-07-29 MED ORDER — MECLIZINE HCL 12.5 MG PO TABS: 12.5000 mg | ORAL_TABLET | ORAL | Status: AC | PRN

## 2014-07-29 MED ORDER — ETONOGESTREL-ETHINYL ESTRADIOL 0.12-0.015 MG/24HR VA RING: 1.0000 | VAGINAL_RING | VAGINAL | Status: DC

## 2014-07-29 MED ORDER — ONDANSETRON HCL 4 MG PO TABS: 4.0000 mg | ORAL_TABLET | Freq: Three times a day (TID) | ORAL | Status: DC | PRN

## 2014-07-29 MED ORDER — SERTRALINE HCL 50 MG PO TABS: ORAL_TABLET | ORAL | Status: DC

## 2014-07-29 NOTE — Patient Instructions (Signed)
Health Maintenance Adopting a healthy lifestyle and getting preventive care can go a long way to promote health and wellness. Talk with your health care provider about what schedule of regular examinations is right for you. This is a good chance for you to check in with your provider about disease prevention and staying healthy. In between checkups, there are plenty of things you can do on your own. Experts have done a lot of research about which lifestyle changes and preventive measures are most likely to keep you healthy. Ask your health care provider for more information. WEIGHT AND DIET  Eat a healthy diet  Be sure to include plenty of vegetables, fruits, low-fat dairy products, and lean protein.  Do not eat a lot of foods high in solid fats, added sugars, or salt.  Get regular exercise. This is one of the most important things you can do for your health.  Most adults should exercise for at least 150 minutes each week. The exercise should increase your heart rate and make you sweat (moderate-intensity exercise).  Most adults should also do strengthening exercises at least twice a week. This is in addition to the moderate-intensity exercise.  Maintain a healthy weight  Body mass index (BMI) is a measurement that can be used to identify possible weight problems. It estimates body fat based on height and weight. Your health care provider can help determine your BMI and help you achieve or maintain a healthy weight.  For females 61 years of age and older:   A BMI below 18.5 is considered underweight.  A BMI of 18.5 to 24.9 is normal.  A BMI of 25 to 29.9 is considered overweight.  A BMI of 30 and above is considered obese.  Watch levels of cholesterol and blood lipids  You should start having your blood tested for lipids and cholesterol at 52 years of age, then have this test every 5 years.  You may need to have your cholesterol levels checked more often if:  Your lipid or  cholesterol levels are high.  You are older than 52 years of age.  You are at high risk for heart disease.  CANCER SCREENING   Lung Cancer  Lung cancer screening is recommended for adults 77-19 years old who are at high risk for lung cancer because of a history of smoking.  A yearly low-dose CT scan of the lungs is recommended for people who:  Currently smoke.  Have quit within the past 15 years.  Have at least a 30-pack-year history of smoking. A pack year is smoking an average of one pack of cigarettes a day for 1 year.  Yearly screening should continue until it has been 15 years since you quit.  Yearly screening should stop if you develop a health problem that would prevent you from having lung cancer treatment.  Breast Cancer  Practice breast self-awareness. This means understanding how your breasts normally appear and feel.  It also means doing regular breast self-exams. Let your health care provider know about any changes, no matter how small.  If you are in your 20s or 30s, you should have a clinical breast exam (CBE) by a health care provider every 1-3 years as part of a regular health exam.  If you are 15 or older, have a CBE every year. Also consider having a breast X-ray (mammogram) every year.  If you have a family history of breast cancer, talk to your health care provider about genetic screening.  If you are  at high risk for breast cancer, talk to your health care provider about having an MRI and a mammogram every year.  Breast cancer gene (BRCA) assessment is recommended for women who have family members with BRCA-related cancers. BRCA-related cancers include:  Breast.  Ovarian.  Tubal.  Peritoneal cancers.  Results of the assessment will determine the need for genetic counseling and BRCA1 and BRCA2 testing. Cervical Cancer Routine pelvic examinations to screen for cervical cancer are no longer recommended for nonpregnant women who are considered low  risk for cancer of the pelvic organs (ovaries, uterus, and vagina) and who do not have symptoms. A pelvic examination may be necessary if you have symptoms including those associated with pelvic infections. Ask your health care provider if a screening pelvic exam is right for you.   The Pap test is the screening test for cervical cancer for women who are considered at risk.  If you had a hysterectomy for a problem that was not cancer or a condition that could lead to cancer, then you no longer need Pap tests.  If you are older than 65 years, and you have had normal Pap tests for the past 10 years, you no longer need to have Pap tests.  If you have had past treatment for cervical cancer or a condition that could lead to cancer, you need Pap tests and screening for cancer for at least 20 years after your treatment.  If you no longer get a Pap test, assess your risk factors if they change (such as having a new sexual partner). This can affect whether you should start being screened again.  Some women have medical problems that increase their chance of getting cervical cancer. If this is the case for you, your health care provider may recommend more frequent screening and Pap tests.  The human papillomavirus (HPV) test is another test that may be used for cervical cancer screening. The HPV test looks for the virus that can cause cell changes in the cervix. The cells collected during the Pap test can be tested for HPV.  The HPV test can be used to screen women 92 years of age and older. Getting tested for HPV can extend the interval between normal Pap tests from three to five years.  An HPV test also should be used to screen women of any age who have unclear Pap test results.  After 52 years of age, women should have HPV testing as often as Pap tests.  Colorectal Cancer  This type of cancer can be detected and often prevented.  Routine colorectal cancer screening usually begins at 52 years of  age and continues through 52 years of age.  Your health care provider may recommend screening at an earlier age if you have risk factors for colon cancer.  Your health care provider may also recommend using home test kits to check for hidden blood in the stool.  A small camera at the end of a tube can be used to examine your colon directly (sigmoidoscopy or colonoscopy). This is done to check for the earliest forms of colorectal cancer.  Routine screening usually begins at age 50.  Direct examination of the colon should be repeated every 5-10 years through 52 years of age. However, you may need to be screened more often if early forms of precancerous polyps or small growths are found. Skin Cancer  Check your skin from head to toe regularly.  Tell your health care provider about any new moles or changes in  moles, especially if there is a change in a mole's shape or color.  Also tell your health care provider if you have a mole that is larger than the size of a pencil eraser.  Always use sunscreen. Apply sunscreen liberally and repeatedly throughout the day.  Protect yourself by wearing long sleeves, pants, a wide-brimmed hat, and sunglasses whenever you are outside. HEART DISEASE, DIABETES, AND HIGH BLOOD PRESSURE   Have your blood pressure checked at least every 1-2 years. High blood pressure causes heart disease and increases the risk of stroke.  If you are between 36 years and 58 years old, ask your health care provider if you should take aspirin to prevent strokes.  Have regular diabetes screenings. This involves taking a blood sample to check your fasting blood sugar level.  If you are at a normal weight and have a low risk for diabetes, have this test once every three years after 52 years of age.  If you are overweight and have a high risk for diabetes, consider being tested at a younger age or more often. PREVENTING INFECTION  Hepatitis B  If you have a higher risk for  hepatitis B, you should be screened for this virus. You are considered at high risk for hepatitis B if:  You were born in a country where hepatitis B is common. Ask your health care provider which countries are considered high risk.  Your parents were born in a high-risk country, and you have not been immunized against hepatitis B (hepatitis B vaccine).  You have HIV or AIDS.  You use needles to inject street drugs.  You live with someone who has hepatitis B.  You have had sex with someone who has hepatitis B.  You get hemodialysis treatment.  You take certain medicines for conditions, including cancer, organ transplantation, and autoimmune conditions. Hepatitis C  Blood testing is recommended for:  Everyone born from [birth year] through 1963.  Anyone with known risk factors for hepatitis C. Sexually transmitted infections (STIs)  You should be screened for sexually transmitted infections (STIs) including gonorrhea and chlamydia if:  You are sexually active and are younger than 52 years of age.  You are older than 52 years of age and your health care provider tells you that you are at risk for this type of infection.  Your sexual activity has changed since you were last screened and you are at an increased risk for chlamydia or gonorrhea. Ask your health care provider if you are at risk.  If you do not have HIV, but are at risk, it may be recommended that you take a prescription medicine daily to prevent HIV infection. This is called pre-exposure prophylaxis (PrEP). You are considered at risk if:  You are sexually active and do not regularly use condoms or know the HIV status of your partner(s).  You take drugs by injection.  You are sexually active with a partner who has HIV. Talk with your health care provider about whether you are at high risk of being infected with HIV. If you choose to begin PrEP, you should first be tested for HIV. You should then be tested every 3 months for  as long as you are taking PrEP.  PREGNANCY   If you are premenopausal and you may become pregnant, ask your health care provider about preconception counseling.  If you may become pregnant, take 400 to 800 micrograms (mcg) of folic acid every day.  If you want to prevent pregnancy, talk to your  health care provider about birth control (contraception). OSTEOPOROSIS AND MENOPAUSE   Osteoporosis is a disease in which the bones lose minerals and strength with aging. This can result in serious bone fractures. Your risk for osteoporosis can be identified using a bone density scan.  If you are 65 years of age or older, or if you are at risk for osteoporosis and fractures, ask your health care provider if you should be screened.  Ask your health care provider whether you should take a calcium or vitamin D supplement to lower your risk for osteoporosis.  Menopause may have certain physical symptoms and risks.  Hormone replacement therapy may reduce some of these symptoms and risks. Talk to your health care provider about whether hormone replacement therapy is right for you.  HOME CARE INSTRUCTIONS   Schedule regular health, dental, and eye exams.  Stay current with your immunizations.   Do not use any tobacco products including cigarettes, chewing tobacco, or electronic cigarettes.  If you are pregnant, do not drink alcohol.  If you are breastfeeding, limit how much and how often you drink alcohol.  Limit alcohol intake to no more than 1 drink per day for nonpregnant women. One drink equals 12 ounces of beer, 5 ounces of wine, or 1 ounces of hard liquor.  Do not use street drugs.  Do not share needles.  Ask your health care provider for help if you need support or information about quitting drugs.  Tell your health care provider if you often feel depressed.  Tell your health care provider if you have ever been abused or do not feel safe at home. Document Released: 10/10/2010  Document Revised: 08/11/2013 Document Reviewed: 02/26/2013 ExitCare Patient Information 2015 [employer]. This information is not intended to replace advice given to you by your health care provider. Make sure you discuss any questions you have with your health care provider. Exercise to Stay Healthy Exercise helps you become and stay healthy. EXERCISE IDEAS AND TIPS Choose exercises that:  You enjoy.  Fit into your day. You do not need to exercise really hard to be healthy. You can do exercises at a slow or medium level and stay healthy. You can:  Stretch before and after working out.  Try yoga, Pilates, or tai chi.  Lift weights.  Walk fast, swim, jog, run, climb stairs, bicycle, dance, or rollerskate.  Take aerobic classes. Exercises that burn about 150 calories:  Running 1  miles in 15 minutes.  Playing volleyball for 45 to 60 minutes.  Washing and waxing a car for 45 to 60 minutes.  Playing touch football for 45 minutes.  Walking 1  miles in 35 minutes.  Pushing a stroller 1  miles in 30 minutes.  Playing basketball for 30 minutes.  Raking leaves for 30 minutes.  Bicycling 5 miles in 30 minutes.  Walking 2 miles in 30 minutes.  Dancing for 30 minutes.  Shoveling snow for 15 minutes.  Swimming laps for 20 minutes.  Walking up stairs for 15 minutes.  Bicycling 4 miles in 15 minutes.  Gardening for 30 to 45 minutes.  Jumping rope for 15 minutes.  Washing windows or floors for 45 to 60 minutes. Document Released: 04/29/2010 Document Revised: 06/19/2011 Document Reviewed: 04/29/2010 ExitCare Patient Information 2015 [employer]. This information is not intended to replace advice given to you by your health care provider. Make sure you discuss any questions you have with your health care provider.  

## 2014-07-29 NOTE — Progress Notes (Signed)
Cordie GriceMelissa Babe 05/12/1960 [id number]    History:    Presents for annual exam. Monthly cycle on NuvaRing with continuous use/hot flashes. Normal Pap history. Began having episodes of vertigo and nausea in March, attributed to migraines, missed 3 weeks of work, taking Meclizine and Zofran as needed with good relief. Also had RLQ pain last Memorial Day, admitted to hospital x 4 days, WBC count elevated, resolved with penicillin antibiotic, no cause identified, negative MRI,  no lingering symptoms. Anxiety, stable on Zoloft, rarely needs Xanax. Benign colon polyps 1999, 2000, 2005, 2010, 5-year follow-up overdue. Paternal uncle died of colon cancer. Continues to experience seasonal allergies, good relief with Zyrtec and Sudafed. Benign breast biopsy 2013. Normal mammograms after  Past medical history, past surgical history, family history and social history were all reviewed and documented in the EPIC chart. Stepdaughter in graduate school at [employer], doing well. Taking MBA classes online at [employer], works at [employer].  ROS:  A ROS was performed and pertinent positives and negatives are included.  Exam:  Filed Vitals:   07/29/14 1537  BP: 128/74    General appearance:  Normal Thyroid:  Symmetrical, normal in size, without palpable masses or nodularity. Respiratory  Auscultation:  Clear without wheezing or rhonchi Cardiovascular  Auscultation:  Regular rate, without rubs, murmurs or gallops  Edema/varicosities:  Not grossly evident Abdominal  Soft,nontender, without masses, guarding or rebound.  Liver/spleen:  No organomegaly noted  Hernia:  None appreciated  Skin  Inspection:  Grossly normal   Breasts: Examined lying and sitting.     Right: Without masses, retractions, discharge or axillary adenopathy.     Left: Without masses, retractions, discharge or axillary adenopathy. Gentitourinary   Inguinal/mons:  Normal without inguinal adenopathy  External genitalia:  Normal  BUS/Urethra/Skene's  glands:  Normal  Vagina:  Normal  Cervix:  Normal  Uterus:  Normal in size, shape and contour.  Midline and mobile  Adnexa/parametria:     Rt: Without masses or tenderness.   Lt: Without masses or tenderness.  Anus and perineum: Normal  Digital rectal exam: Normal sphincter tone without palpated masses or tenderness  Assessment/Plan:  54 y.o. MWF G0  for annual exam.   Monthly cycles, hot flashes, NuvaRing continuously Vertigo secondary to migraines, relieved with Meclizine and Zofran Seasonal allergies, relief with Zyrtec and Sudafed  Anxiety, stable on Zoloft Multiple colon polyps, follow-up overdue 2015 Benign breast biopsy 2013, overdue for screening mammogram  Plan: SBEs, overdue for screening mammogram and instructed to schedule calcium-rich diet, Vit D 1000 daily, MVI, decrease calories and increase exercise for weight loss encouraged. NuvaRing prescription, proper use, slight risks of blood clots/stroke reviewed. Prescriptions for Zyrtec and Sudafed given. Continue with Zoloft daily, prescription given 75 mg daily, denies need for ongoing counseling. Meclizine 12.5 mg and Zofran 4 mg prescriptions given to be used as needed, follow-up with neurologist if needed. Will schedule follow-up colonoscopy. CBC, CMP, lipid panel, TSH, FSH, UA . Pap normal 2015, new screening guidelines reviewed.    Harrington ChallengerYOUNG,Charlie [facility] McduffieWHNP, 4:34 PM 07/29/2014

## 2014-07-30 LAB — URINALYSIS W MICROSCOPIC + REFLEX CULTURE
Bacteria, UA: NONE SEEN
Bilirubin Urine: NEGATIVE
Casts: NONE SEEN
Crystals: NONE SEEN
HGB URINE DIPSTICK: NEGATIVE
Ketones, ur: NEGATIVE mg/dL
LEUKOCYTES UA: NEGATIVE
NITRITE: NEGATIVE
PROTEIN: NEGATIVE mg/dL
SQUAMOUS EPITHELIAL / LPF: NONE SEEN
Specific Gravity, Urine: >1.03 — ABNORMAL HIGH (ref 1.005–1.030)
UROBILINOGEN UA: 0.2 mg/dL (ref 0.0–1.0)
pH: 5.5 (ref 5.0–8.0)

## 2014-07-30 LAB — FOLLICLE STIMULATING HORMONE: FSH: 1 m[IU]/mL

## 2014-07-31 LAB — HEMOGLOBIN A1C
HEMOGLOBIN A1C: 9.8 % — AB (ref <5.7)
Mean Plasma Glucose: 235 mg/dL — ABNORMAL HIGH (ref <117)

## 2014-08-01 NOTE — Discharge Summary (Signed)
PATIENT NAME:  Alexandria Wheeler, Alexandria Wheeler MR#:  [id number] DATE OF BIRTH:  1960-11-15  DATE OF ADMISSION:  08/28/2013 DATE OF DISCHARGE:  08/30/2013  BRIEF HISTORY: Alexandria Wheeler is a 54 year old woman seen in the Emergency Room with nausea and abdominal pain. She was in usual state of good health, when she developed some profound nausea. She did not vomit, but had significant anorexia. She presented to the Emergency Room for further evaluation, where a CT scan demonstrated normal-sized appendix without any evidence of stranding, but the appendix did not fill with oral contrast. and there was a trace amount of periappendiceal fluid. The findings were nonspecific, but with the patient's white blood cell count of 22,000, surgical service was consulted.    HOSPITAL COURSE: She was admitted to the hospital on empiric antibiotics. Clinical impression was not consistent with appendicitis on clinical examination. White count was down to 10,000 the following morning, and she was able to tolerate p.o. pain medicines and antibiotics. She was discharged home on the following day to be followed in the office in 7 to 10 days' time. Bathing, activity and driving instructions were given to the patient.   DISCHARGE MEDICATIONS: She was discharged home on her admission medications: 1. Treximet 500 mg/85 mg once a day.  2. NuvaRing vaginally every 4 weeks. 3. Zoloft 50 mg once a day. 4. Sudafed 120 mg every 12 hours. 5. Zyrtec 10 mg once a day. 6. Zonisamide 150 mg at bedtime. 7. Flexeril 10 mg t.i.d. p.r.n. 8. Xanax 0.5 mg p.o. daily p.r.n. 9. Hydrocodone/acetaminophen 5/325 every 4 to 6 hours p.r.n.  10. Clindamycin 300 mg every 8 hours for 5 days. 11. Cephalexin 500 mg every 8 hours for 5 days.   FINAL DISCHARGE DIAGNOSIS: Abdominal pain.   She does not have a local physician    ____________________________ Carmie Endalph L. Ely III, MD rle:lb D: 09/12/2013 20:25:29 ET T: 09/13/2013 05:54:13 ET JOB#: [id number]  cc: Quentin Orealph  L. Ely III, MD, <Dictator> Quentin OreALPH L ELY MD ELECTRONICALLY SIGNED 09/13/2013 6:48

## 2014-08-01 NOTE — H&P (Signed)
PATIENT NAME:  Alexandria Wheeler, Alexandria Wheeler MR#:  [id number] DATE OF BIRTH:  01-21-1961  DATE OF ADMISSION:  08/28/2013   PRIMARY CARE PHYSICIAN: [place]necologist [place], [place].   ADMITTING PHYSICIAN: Dr. Michela PitcherEly.   CHIEF COMPLAINT: Abdominal pain and nausea.   BRIEF HISTORY: The patient is a 54 year old woman who was in her usual state of good health until approximately 36 hours ago when she began to develop some profound nausea. She felt unsettled and anorexic. She did not vomit. Approximately 24 hours later, she began to develop some significant periumbilical right lower quadrant pain. The pain intensified over the course of the day until she was actually doubled up. She presented to the Emergency Room for further evaluation. She denied any fever. She had not had any GI abnormalities with no diarrhea or constipation.  Workup in the Emergency Room revealed white blood cell count of 22,000. She had pronounced right lower quadrant pain. CT scan was performed with contrast. CT scan demonstrated normal-sized appendix without evidence of any appendiceal thickening.  Does not appear to have any evidence of stranding. The appendix did fail to fill with oral contrast and there was a trace amount of periappendiceal fluid in the right lower quadrant. The findings were significantly nonspecific. Because of the patient's abdominal discomfort, the Emergency Room physician requested surgical evaluation.   She has no previous history of significant GI problems including hepatitis, yellow jaundice, pancreatitis, peptic ulcer disease, gallbladder disease, or diverticulitis. She denies any abdominal surgery. She does have significant history of allergies and migraines. She denies any cardiac disease, hypertension, or diabetes. She has no thyroid problems by history. She does have a history of pronounced depression.   ADMISSION MEDICATIONS: Include Xanax 0.5 mg p.o. q. 8 hours p.r.n., Flexeril 10 mg p.o.  p.r.n. headache, Treximet 500 mg/85 mg once a day as needed for headaches, Zoloft 50 mg once a day, Sudafed 120 mg every 12 hours, and Zyrtec 10 mg once a day.   ALLERGIES: SHE IS ALLERGIC TO SULFA DRUGS WHICH DO CAUSE SIGNIFICANT RASH.   REVIEW OF SYSTEMS:  Otherwise unremarkable.   SOCIAL HISTORY: She is not a cigarette smoker. She does not drink alcohol.   PHYSICAL EXAMINATION:  GENERAL: She is an alert, pleasant woman in no significant distress. She is lying comfortably in bed. Pain scale is rated at a 5.  VITAL SIGNS: Blood pressure is 120/60, heart rate is 90 and regular. She is afebrile.  HEENT: Unremarkable. She has no scleral icterus. No pupillary abnormalities. No facial deformities.  NECK: Supple. Nontender. Midline trachea. No adenopathy.  CHEST: Clear with no adventitious sounds.  She has normal pulmonary excursion.  CARDIAC: Reveals no murmurs or gallops. She seems to be in normal sinus rhythm.  ABDOMEN: Markedly tender right lower quadrant with guarding, but no rebound. It is otherwise soft with no abdominal distention. No hernias are noted.  EXTREMITIES: Full range of motion. No deformities and good distal pulses.  PSYCHIATRIC: Normal orientation, normal affect.   IMPRESSION: Clinically, she does have significant right lower quadrant pain and 22,000 white count, but her CT evidence does not support a white count of that nature. Either she has a very poor pain tolerance or she has some other problem beside acute appendicitis. We will admit her to the hospital, place her on empiric antibiotics, keep her n.p.o., rehydrate her and do serial examinations over the next 12 hours. This plan has been discussed with the patient and her husband and they are in agreement.  Should she continue to have similar symptoms, we would consider moving to diagnostic laparoscopy. This plan has been outlined to the patient and her husband.    ____________________________ Carmie End,  MD rle:dd D: 08/28/2013 02:25:00 ET T: 08/28/2013 02:52:39 ET JOB#: [id number]  cc: Quentin Ore III, MD, <Dictator> Quentin Ore MD ELECTRONICALLY SIGNED 08/30/2013 10:13

## 2014-08-03 ENCOUNTER — Telehealth: Payer: Self-pay | Admitting: *Deleted

## 2014-08-03 NOTE — Telephone Encounter (Signed)
Pt called with the name of the endocrinology in [place], Dr. Leretha PolElizabeth Harris at [phone] notes faxed to [phone] per pt request.

## 2015-04-16 ENCOUNTER — Telehealth: Payer: Self-pay | Admitting: *Deleted

## 2015-04-16 MED ORDER — FLUCONAZOLE 150 MG PO TABS: 150.0000 mg | ORAL_TABLET | Freq: Once | ORAL | Status: DC

## 2015-04-16 NOTE — Telephone Encounter (Signed)
(  You are back up MD) pt called c/o yeast infection itching and white discharge requesting Rx. Please advise

## 2015-04-16 NOTE — Telephone Encounter (Signed)
Call in prescription for Diflucan 150 mg 1 by mouth with 1 refill

## 2015-04-16 NOTE — Telephone Encounter (Signed)
Left on voicemail Rx sent 

## 2015-04-19 ENCOUNTER — Other Ambulatory Visit: Payer: Self-pay | Admitting: Gynecology

## 2015-04-21 ENCOUNTER — Encounter: Payer: Self-pay | Admitting: Women's Health

## 2015-04-21 ENCOUNTER — Ambulatory Visit (INDEPENDENT_AMBULATORY_CARE_PROVIDER_SITE_OTHER): Payer: BC Managed Care – PPO | Admitting: Women's Health

## 2015-04-21 VITALS — BP 132/80 | Ht 66.0 in | Wt 175.0 lb

## 2015-04-21 DIAGNOSIS — R300 Dysuria: Secondary | ICD-10-CM

## 2015-04-21 DIAGNOSIS — A609 Anogenital herpesviral infection, unspecified: Secondary | ICD-10-CM | POA: Diagnosis not present

## 2015-04-21 DIAGNOSIS — N898 Other specified noninflammatory disorders of vagina: Secondary | ICD-10-CM

## 2015-04-21 DIAGNOSIS — E119 Type 2 diabetes mellitus without complications: Secondary | ICD-10-CM | POA: Insufficient documentation

## 2015-04-21 DIAGNOSIS — N3001 Acute cystitis with hematuria: Secondary | ICD-10-CM | POA: Diagnosis not present

## 2015-04-21 LAB — URINALYSIS W MICROSCOPIC + REFLEX CULTURE
Bilirubin Urine: NEGATIVE
CASTS: NONE SEEN [LPF]
Crystals: NONE SEEN [HPF]
Glucose, UA: NEGATIVE
KETONES UR: NEGATIVE
NITRITE: NEGATIVE
PH: 6 (ref 5.0–8.0)
SPECIFIC GRAVITY, URINE: 1.02 (ref 1.001–1.035)
YEAST: NONE SEEN [HPF]

## 2015-04-21 LAB — WET PREP FOR TRICH, YEAST, CLUE
TRICH WET PREP: NONE SEEN
YEAST WET PREP: NONE SEEN

## 2015-04-21 MED ORDER — ACYCLOVIR 5 % EX OINT: 1.0000 "application " | TOPICAL_OINTMENT | CUTANEOUS | Status: DC

## 2015-04-21 MED ORDER — CIPROFLOXACIN HCL 250 MG PO TABS: 250.0000 mg | ORAL_TABLET | Freq: Two times a day (BID) | ORAL | Status: DC

## 2015-04-21 MED ORDER — VALACYCLOVIR HCL 1 G PO TABS: 1000.0000 mg | ORAL_TABLET | Freq: Two times a day (BID) | ORAL | Status: DC

## 2015-04-21 NOTE — Patient Instructions (Signed)
Urinary Tract Infection Urinary tract infections (UTIs) can develop anywhere along your urinary tract. Your urinary tract is your body's drainage system for removing wastes and extra water. Your urinary tract includes two kidneys, two ureters, a bladder, and a urethra. Your kidneys are a pair of bean-shaped organs. Each kidney is about the size of your fist. They are located below your ribs, one on each side of your spine. CAUSES Infections are caused by microbes, which are microscopic organisms, including fungi, viruses, and bacteria. These organisms are so small that they can only be seen through a microscope. Bacteria are the microbes that most commonly cause UTIs. SYMPTOMS  Symptoms of UTIs may vary by age and gender of the patient and by the location of the infection. Symptoms in [place] women typically include a frequent and intense urge to urinate and a painful, burning feeling in the bladder or urethra during urination. Older women and men are more likely to be tired, shaky, and weak and have muscle aches and abdominal pain. A fever may mean the infection is in your kidneys. Other symptoms of a kidney infection include pain in your back or sides below the ribs, nausea, and vomiting. DIAGNOSIS To diagnose a UTI, your caregiver will ask you about your symptoms. Your caregiver will also ask you to provide a urine sample. The urine sample will be tested for bacteria and white blood cells. White blood cells are made by your body to help fight infection. TREATMENT  Typically, UTIs can be treated with medication. Because most UTIs are caused by a bacterial infection, they usually can be treated with the use of antibiotics. The choice of antibiotic and length of treatment depend on your symptoms and the type of bacteria causing your infection. HOME CARE INSTRUCTIONS  If you were prescribed antibiotics, take them exactly as your caregiver instructs you. Finish the medication even if you feel better after  you have only taken some of the medication.  Drink enough water and fluids to keep your urine clear or pale yellow.  Avoid caffeine, tea, and carbonated beverages. They tend to irritate your bladder.  Empty your bladder often. Avoid holding urine for long periods of time.  Empty your bladder before and after sexual intercourse.  After a bowel movement, women should cleanse from front to back. Use each tissue only once. SEEK MEDICAL CARE IF:   You have back pain.  You develop a fever.  Your symptoms do not begin to resolve within 3 days. SEEK IMMEDIATE MEDICAL CARE IF:   You have severe back pain or lower abdominal pain.  You develop chills.  You have nausea or vomiting.  You have continued burning or discomfort with urination. MAKE SURE YOU:   Understand these instructions.  Will watch your condition.  Will get help right away if you are not doing well or get worse.   This information is not intended to replace advice given to you by your health care provider. Make sure you discuss any questions you have with your health care provider.   Document Released: 01/04/2005 Document Revised: 12/16/2014 Document Reviewed: 05/05/2011 Elsevier Interactive Patient Education 2016 Else[employer] Inc[employer] Cold Sore A cold sore (fever blister) is a skin infection caused by the herpes simplex virus (HSV-1). HSV-1 is closely related to the virus that causes genital herpes (HSV-2), but they are not the same even though both viruses can cause oral and genital infections. Cold sores are small, fluid-filled sores inside of the mouth or on the lips, gums,  nose, chin, cheeks, or fingers.  The herpes simplex virus can be easily passed (contagious) to other people through close personal contact, such as kissing or sharing personal items. The virus can also spread to other parts of the body, such as the eyes or genitals. Cold sores are contagious until the sores crust over completely. They often heal within 2  weeks.  Once a person is infected, the herpes simplex virus remains permanently in the body. Therefore, there is no cure for cold sores, and they often recur when a person is tired, stressed, sick, or gets too much sun. Additional factors that can cause a recurrence include hormone changes in menstruation or pregnancy, certain drugs, and cold weather.  CAUSES  Cold sores are caused by the herpes simplex virus. The virus is spread from person to person through close contact, such as through kissing, touching the affected area, or sharing personal items such as lip balm, razors, or eating utensils.  SYMPTOMS  The first infection may not cause symptoms. If symptoms develop, the symptoms often go through different stages. Here is how a cold sore develops:   Tingling, itching, or burning is felt 1-2 days before the outbreak.   Fluid-filled blisters appear on the lips, inside the mouth, nose, or on the cheeks.   The blisters start to ooze clear fluid.   The blisters dry up and a yellow crust appears in its place.   The crust falls off.  Symptoms depend on whether it is the initial outbreak or a recurrence. Some other symptoms with the first outbreak may include:   Fever.   Sore throat.   Headache.   Muscle aches.   Swollen neck glands.  DIAGNOSIS  A diagnosis is often made based on your symptoms and looking at the sores. Sometimes, a sore may be swabbed and then examined in the lab to make a final diagnosis. If the sores are not present, blood tests can find the herpes simplex virus.  TREATMENT  There is no cure for cold sores and no vaccine for the herpes simplex virus. Within 2 weeks, most cold sores go away on their own without treatment. Medicines cannot make the infection go away, but medicine can help relieve some of the pain associated with the sores, can work to stop the virus from multiplying, and can also shorten healing time. Medicine may be in the form of creams, gels,  pills, or a shot.  HOME CARE INSTRUCTIONS   Only take over-the-counter or prescription medicines for pain, discomfort, or fever as directed by your caregiver. Do not use aspirin.   Use a cotton-tip swab to apply creams or gels to your sores.   Do not touch the sores or pick the scabs. Wash your hands often. Do not touch your eyes without washing your hands first.   Avoid kissing, oral sex, and sharing personal items until sores heal.   Apply an ice pack on your sores for 10-15 minutes to ease any discomfort.   Avoid hot, cold, or salty foods because they may hurt your mouth. Eat a soft, bland diet to avoid irritating the sores. Use a straw to drink if you have pain when drinking out of a glass.   Keep sores clean and dry to prevent an infection of other tissues.   Avoid the sun and limit stress if these things trigger outbreaks. If sun causes cold sores, apply sunscreen on the lips before being out in the sun.  SEEK MEDICAL CARE IF:  You have a fever or persistent symptoms for more than 2-3 days.   You have a fever and your symptoms suddenly get worse.   You have pus, not clear fluid, coming from the sores.   You have redness that is spreading.   You have pain or irritation in your eye.   You get sores on your genitals.   Your sores do not heal within 2 weeks.   You have a weakened immune system.   You have frequent recurrences of cold sores.  MAKE SURE YOU:   Understand these instructions.  Will watch your condition.  Will get help right away if you are not doing well or get worse.   This information is not intended to replace advice given to you by your health care provider. Make sure you discuss any questions you have with your health care provider.   Document Released: 03/24/2000 Document Revised: 04/17/2014 Document Reviewed: 08/09/2011 Elsevier Interactive Patient Education [employer] Elsevier Inc[employer]

## 2015-04-21 NOTE — Progress Notes (Signed)
Patient ID: Alexandria GriceMelissa Wheeler, female   DOB: 03/13/1961, 55 y.o.   MRN: [id number] Presents with 5 day history of urinary and vaginal symptoms. Vaginal /perineal pain, swelling, itching, discharge, blisters, pain with urination and pain at end of stream of urination. Used over-the-counter Monistat with some relief. Denies abdominal pain or fever. History of HSV-1. 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 Same partner. Postmenopausal with no bleeding. Type 2 diabetes started on insulin this past year, hemoglobin A1c now 7, had been 10.  Exam: Appears uncomfortable. External genitalia multiple painful lesions, HSV culture taken. Wet prep done with a Q-tip, positive for few clues and many bacteria, negative yeast. UA: +1 leukocytes, +1 blood, 40-60 WBCs, 10-20 rbc's, many bacteria.  Probable HSV UTI  Plan: Valtrex 1000 by mouth twice a day for 10 days, prescription, proper use given, Zovirax 5% cream prescription given. Instructed to wear loose clothing, call if no relief. Cipro 250 twice daily for 3 days prescription, proper use given and reviewed. Reviewed there was a small amount of bacteria will watch at this time. Will call with HSV culture results. Urine culture pending.

## 2015-04-22 LAB — URINE CULTURE: Colony Count: >=100000

## 2015-04-30 ENCOUNTER — Encounter: Payer: Self-pay | Admitting: Gynecology

## 2015-06-16 ENCOUNTER — Emergency Department: Payer: BC Managed Care – PPO

## 2015-06-16 ENCOUNTER — Emergency Department
Admission: EM | Admit: 2015-06-16 | Discharge: 2015-06-16 | Disposition: A | Discharge status: Home / Self Care | Payer: BC Managed Care – PPO | Attending: Student | Admitting: Student

## 2015-06-16 DIAGNOSIS — R000 Tachycardia, unspecified: Secondary | ICD-10-CM | POA: Insufficient documentation

## 2015-06-16 DIAGNOSIS — Z3202 Encounter for pregnancy test, result negative: Secondary | ICD-10-CM | POA: Diagnosis not present

## 2015-06-16 DIAGNOSIS — R1031 Right lower quadrant pain: Secondary | ICD-10-CM | POA: Insufficient documentation

## 2015-06-16 DIAGNOSIS — R109 Unspecified abdominal pain: Secondary | ICD-10-CM | POA: Diagnosis present

## 2015-06-16 DIAGNOSIS — Z79899 Other long term (current) drug therapy: Secondary | ICD-10-CM | POA: Insufficient documentation

## 2015-06-16 DIAGNOSIS — R110 Nausea: Secondary | ICD-10-CM | POA: Insufficient documentation

## 2015-06-16 DIAGNOSIS — Z794 Long term (current) use of insulin: Secondary | ICD-10-CM | POA: Diagnosis not present

## 2015-06-16 DIAGNOSIS — E119 Type 2 diabetes mellitus without complications: Secondary | ICD-10-CM | POA: Diagnosis not present

## 2015-06-16 LAB — COMPREHENSIVE METABOLIC PANEL
ALK PHOS: 59 U/L (ref 38–126)
ALT: 14 U/L (ref 14–54)
AST: 19 U/L (ref 15–41)
Albumin: 3.4 g/dL — ABNORMAL LOW (ref 3.5–5.0)
Anion gap: 9 (ref 5–15)
BUN: 15 mg/dL (ref 6–20)
CALCIUM: 8.9 mg/dL (ref 8.9–10.3)
CO2: 20 mmol/L — AB (ref 22–32)
CREATININE: 0.56 mg/dL (ref 0.44–1.00)
Chloride: 107 mmol/L (ref 101–111)
Glucose, Bld: 156 mg/dL — ABNORMAL HIGH (ref 65–99)
Potassium: 4.4 mmol/L (ref 3.5–5.1)
Sodium: 136 mmol/L (ref 135–145)
Total Bilirubin: 0.4 mg/dL (ref 0.3–1.2)
Total Protein: 7.3 g/dL (ref 6.5–8.1)

## 2015-06-16 LAB — URINALYSIS COMPLETE WITH MICROSCOPIC (ARMC ONLY)
BILIRUBIN URINE: NEGATIVE
GLUCOSE, UA: NEGATIVE mg/dL
HGB URINE DIPSTICK: NEGATIVE
Ketones, ur: NEGATIVE mg/dL
Leukocytes, UA: NEGATIVE
Nitrite: NEGATIVE
PH: 5 (ref 5.0–8.0)
PROTEIN: NEGATIVE mg/dL
Specific Gravity, Urine: 1.025 (ref 1.005–1.030)

## 2015-06-16 LAB — LIPASE, BLOOD: Lipase: 33 U/L (ref 11–51)

## 2015-06-16 LAB — CBC
HCT: 45 % (ref 35.0–47.0)
Hemoglobin: 15.1 g/dL (ref 12.0–16.0)
MCH: 28.9 pg (ref 26.0–34.0)
MCHC: 33.6 g/dL (ref 32.0–36.0)
MCV: 86.1 fL (ref 80.0–100.0)
Platelets: 305 10*3/uL (ref 150–440)
RBC: 5.23 MIL/uL — AB (ref 3.80–5.20)
RDW: 13.3 % (ref 11.5–14.5)
WBC: 17.2 10*3/uL — AB (ref 3.6–11.0)

## 2015-06-16 LAB — PREGNANCY, URINE: PREG TEST UR: NEGATIVE

## 2015-06-16 MED ORDER — MORPHINE SULFATE (PF) 4 MG/ML IV SOLN
4.0000 mg | Freq: Once | INTRAVENOUS | Status: AC
  Administered 2015-06-16: 4 mg via INTRAVENOUS
  Filled 2015-06-16: qty 1

## 2015-06-16 MED ORDER — IOHEXOL 300 MG/ML  SOLN
100.0000 mL | Freq: Once | INTRAMUSCULAR | Status: AC | PRN
  Administered 2015-06-16: 100 mL via INTRAVENOUS

## 2015-06-16 MED ORDER — OXYCODONE HCL 5 MG PO TABS: 5.0000 mg | ORAL_TABLET | Freq: Once | ORAL | Status: DC

## 2015-06-16 MED ORDER — ONDANSETRON HCL 4 MG/2ML IJ SOLN
4.0000 mg | Freq: Once | INTRAMUSCULAR | Status: AC
  Administered 2015-06-16: 4 mg via INTRAVENOUS
  Filled 2015-06-16: qty 2

## 2015-06-16 MED ORDER — IOHEXOL 240 MG/ML SOLN
25.0000 mL | Freq: Once | INTRAMUSCULAR | Status: AC | PRN
  Administered 2015-06-16: 25 mL via ORAL

## 2015-06-16 MED ORDER — ONDANSETRON 4 MG PO TBDP: 4.0000 mg | ORAL_TABLET | Freq: Three times a day (TID) | ORAL | Status: DC | PRN

## 2015-06-16 MED ORDER — SODIUM CHLORIDE 0.9 % IV BOLUS (SEPSIS)
1000.0000 mL | Freq: Once | INTRAVENOUS | Status: AC
  Administered 2015-06-16: 1000 mL via INTRAVENOUS

## 2015-06-16 NOTE — ED Provider Notes (Signed)
[facility] Emergency Department Provider Note  ____________________________________________  Time seen: Approximately 9:19 AM  I have reviewed the triage vital signs and the nursing notes.   HISTORY  Chief Complaint Abdominal Pain    HPI Alexandria Wheeler is a 55 y.o. female with history of migraines and diabetes who presents for evaluation of 3 days of abdominal pain, worse in the right abdomen, gradual onset, constant since onset, currently moderate, no modifying factors. He has also had some nausea but no vomiting and  no diarrhea. No subjective fever, no chills. No chest pain or difficulty breathing. No cough, sneezing, runny nose, congestion, or dysuria.   Past Medical History  Diagnosis Date  . Migraines   . Vertigo   . Diabetes mellitus without complication [facility])     Patient Active Problem List   Diagnosis Date Noted  . Diabetes mellitus (HCC) 04/21/2015  . H/O benign left breast biopsy 07/24/2013  . Personal history of colonic polyps 07/24/2013  . Anxiety state, unspecified 06/19/2012  . Migraines 06/05/2011    Past Surgical History  Procedure Laterality Date  . Colonoscopy      POLYPS IN 1995; 2000; 2005; 2010; BY DR.ROBERT  HOLMES  . Breast biopsy  1-13    [place]    Current Outpatient Rx  Name  Route  Sig  Dispense  Refill  . ALPRAZolam (XANAX) 0.25 MG tablet   Oral   Take 1 tablet (0.25 mg total) by mouth at bedtime as needed for anxiety.   30 tablet   1   . cetirizine (ZYRTEC) 10 MG tablet   Oral   Take 1 tablet (10 mg total) by mouth daily.   30 tablet   12   . cyclobenzaprine (FLEXERIL) 10 MG tablet   Oral   Take 10 mg by mouth 2 (two) times daily as needed.           Marland Kitchen EPINEPHrine 0.3 mg/0.3 mL IJ SOAJ injection   Intramuscular   Inject 0.3 mg into the muscle as needed.         . etonogestrel-ethinyl estradiol (NUVARING) 0.12-0.015 MG/24HR vaginal ring   Vaginal   Place 1 each vaginally every 28  (twenty-eight) days. Insert vaginally and leave in place for 3 consecutive weeks, then remove for 1 week.   3 each   4   . Insulin Glargine (BASAGLAR KWIKPEN) 100 UNIT/ML SOPN   Subcutaneous   Inject 35 Units into the skin at bedtime.         . meclizine (ANTIVERT) 12.5 MG tablet   Oral   Take 1 tablet (12.5 mg total) by mouth as needed for dizziness.   30 tablet   2   . Multiple Vitamin (MULTIVITAMIN) tablet   Oral   Take 1 tablet by mouth daily.           . pseudoephedrine (SUDAFED) 30 MG tablet   Oral   Take 30 mg by mouth every 4 (four) hours as needed for congestion.         . sertraline (ZOLOFT) 50 MG tablet      take 1 tablet by mouth once daily   30 tablet   12   . SUMAtriptan-naproxen (TREXIMET) 85-500 MG per tablet   Oral   Take 1 tablet by mouth every 2 (two) hours as needed.           . zonisamide (ZONEGRAN) 50 MG capsule   Oral   Take 200 mg by mouth daily.  0   . ondansetron (ZOFRAN ODT) 4 MG disintegrating tablet   Oral   Take 1 tablet (4 mg total) by mouth every 8 (eight) hours as needed for nausea or vomiting.   8 tablet   0     Allergies Sulfa antibiotics  Family History  Problem Relation Age of Onset  . Hypertension Mother   . Heart disease Father   . Cancer Maternal Aunt     UTERINE  . Cancer Maternal Uncle     MELANOMA  . Diabetes Paternal Uncle   . Cancer Paternal Uncle     COLON    Social History Social History  Substance Use Topics  . Smoking status: Never Smoker   . Smokeless tobacco: Never Used  . Alcohol Use: No    Review of Systems Constitutional: No fever/chills Eyes: No visual changes. ENT: No sore throat. Cardiovascular: Denies chest pain. Respiratory: Denies shortness of breath. Gastrointestinal: + abdominal pain.  + nausea, no vomiting.  No diarrhea.  No constipation. Genitourinary: Negative for dysuria. Musculoskeletal: Negative for back pain. Skin: Negative for rash. Neurological: Negative  for headaches, focal weakness or numbness.  10-point ROS otherwise negative.  ____________________________________________   PHYSICAL EXAM:  VITAL SIGNS: ED Triage Vitals  Enc Vitals Group     BP 06/16/15 0852 124/63 mmHg     Pulse Rate 06/16/15 0852 108     Resp 06/16/15 0852 15     Temp 06/16/15 0852 98.2 F (36.8 C)     Temp Source 06/16/15 0852 Oral     SpO2 06/16/15 0852 95 %     Weight 06/16/15 0852 170 lb (77.111 kg)     Height 06/16/15 0852 5\' 6"  (1.676 m)     Head Cir --      Peak Flow --      Pain Score 06/16/15 0856 4     Pain Loc --      Pain Edu? --      Excl. in GC? --     Constitutional: Alert and oriented. Well appearing and in no acute distress. Eyes: Conjunctivae are normal. PERRL. EOMI. Head: Atraumatic. Nose: No congestion/rhinnorhea. Mouth/Throat: Mucous membranes are moist.  Oropharynx non-erythematous. Neck: No stridor.  Supple without meningismus. Cardiovascular: Mildly tachycardic rate, regular rhythm. Grossly normal heart sounds.  Good peripheral circulation. Respiratory: Normal respiratory effort.  No retractions. Lungs CTAB. Gastrointestinal: Soft with tenderness in the right mid abdomen and the right lower quadrant as well as the suprapubic region.Marland Kitchen. No CVA tenderness. Genitourinary: Deferred Musculoskeletal: No lower extremity tenderness nor edema.  No joint effusions. Neurologic:  Normal speech and language. No gross focal neurologic deficits are appreciated.  Skin:  Skin is warm, dry and intact. No rash noted. Psychiatric: Mood and affect are normal. Speech and behavior are normal.  ____________________________________________   LABS (all labs ordered are listed, but only abnormal results are displayed)  Labs Reviewed  COMPREHENSIVE METABOLIC PANEL - Abnormal; Notable for the following:    CO2 20 (*)    Glucose, Bld 156 (*)    Albumin 3.4 (*)    All other components within normal limits  CBC - Abnormal; Notable for the following:     WBC 17.2 (*)    RBC 5.23 (*)    All other components within normal limits  URINALYSIS COMPLETEWITH MICROSCOPIC ([facility] ONLY) - Abnormal; Notable for the following:    Color, Urine YELLOW (*)    APPearance CLEAR (*)    Bacteria, UA RARE (*)  Squamous Epithelial / LPF 0-5 (*)    All other components within normal limits  LIPASE, BLOOD  PREGNANCY, URINE   ____________________________________________  EKG  none ____________________________________________  RADIOLOGY  CT abdomen and pelvis IMPRESSION: No acute finding or significant abnormality. ____________________________________________   PROCEDURES  Procedure(s) performed: None  Critical Care performed: No  ____________________________________________   INITIAL IMPRESSION / ASSESSMENT AND PLAN / ED COURSE  Pertinent labs & imaging results that were available during my care of the patient were reviewed by me and considered in my medical decision making (see chart for details).  Alexandria Wheeler is a 55 y.o. female with history of migraines and diabetes who presents for evaluation of 3 days of abdominal pain, worse in the right abdomen associated with nausea. She does have tenderness in the right lower quadrant. On arrival, mildly tachycardic but the remainder of her vital signs are stable, she is afebrile. She has notable leukocytosis with white blood cell count of 17,000. CMP and lipase unremarkable. Urine pregnancy test is negative and urinalysis not consistent with infection. Awaiting CT of the abdomen and pelvis to eval for appendicitis. We'll give pain control, IV fluids, reassess.  ----------------------------------------- 12:40 PM on 06/16/2015 ----------------------------------------- Patient reports that she feels much better at this time. CT of the abdomen and pelvis shows no acute abnormality. Tachycardia resolved. We discussed return precautions, need for close PCP follow-up, symptomatic control with  over-the-counter pain medications according to package instructions as well as Zofran and she is comfortable with the discharge plan. DC home. ____________________________________________   FINAL CLINICAL IMPRESSION(S) / ED DIAGNOSES  Final diagnoses:  Abdominal pain, unspecified abdominal location      Gayla Doss, MD 06/16/15 1530

## 2015-06-16 NOTE — ED Notes (Signed)
Patient transported to CT 

## 2015-06-16 NOTE — ED Notes (Signed)
Pt c/o lower abd pain that started on the LLQ and is now concentrated in the RLQ with N/V/D for the past 2-3 days. C/o generalized weakness and dizziness

## 2015-06-16 NOTE — ED Notes (Signed)
Pt informed to return if any life threatening symptoms occur.  

## 2015-06-16 NOTE — ED Notes (Signed)
Pt resting in bed, husband at bedside, pt in no acute distress 

## 2015-06-25 ENCOUNTER — Other Ambulatory Visit: Payer: Self-pay | Admitting: Women's Health

## 2015-07-13 DIAGNOSIS — J309 Allergic rhinitis, unspecified: Secondary | ICD-10-CM | POA: Diagnosis not present

## 2015-08-03 DIAGNOSIS — J309 Allergic rhinitis, unspecified: Secondary | ICD-10-CM | POA: Diagnosis not present

## 2015-08-04 ENCOUNTER — Encounter: Payer: Self-pay | Admitting: Women's Health

## 2015-08-04 ENCOUNTER — Ambulatory Visit (INDEPENDENT_AMBULATORY_CARE_PROVIDER_SITE_OTHER): Payer: BC Managed Care – PPO | Admitting: Women's Health

## 2015-08-04 VITALS — BP 128/80 | Ht 66.0 in | Wt 177.0 lb

## 2015-08-04 DIAGNOSIS — F32A Depression, unspecified: Secondary | ICD-10-CM

## 2015-08-04 DIAGNOSIS — Z01419 Encounter for gynecological examination (general) (routine) without abnormal findings: Secondary | ICD-10-CM | POA: Diagnosis not present

## 2015-08-04 DIAGNOSIS — Z13220 Encounter for screening for lipoid disorders: Secondary | ICD-10-CM | POA: Diagnosis not present

## 2015-08-04 DIAGNOSIS — F329 Major depressive disorder, single episode, unspecified: Secondary | ICD-10-CM | POA: Diagnosis not present

## 2015-08-04 DIAGNOSIS — Z3040 Encounter for surveillance of contraceptives, unspecified: Secondary | ICD-10-CM | POA: Diagnosis not present

## 2015-08-04 DIAGNOSIS — E1310 Other specified diabetes mellitus with ketoacidosis without coma: Secondary | ICD-10-CM

## 2015-08-04 DIAGNOSIS — N912 Amenorrhea, unspecified: Secondary | ICD-10-CM

## 2015-08-04 DIAGNOSIS — E1110 Type 2 diabetes mellitus with ketoacidosis without coma: Secondary | ICD-10-CM

## 2015-08-04 DIAGNOSIS — Z86010 Personal history of colonic polyps: Secondary | ICD-10-CM

## 2015-08-04 LAB — LIPID PANEL
Cholesterol: 184 mg/dL (ref 125–200)
HDL: 54 mg/dL (ref >=46)
LDL CALC: 103 mg/dL (ref <130)
TRIGLYCERIDES: 135 mg/dL (ref <150)
Total CHOL/HDL Ratio: 3.4 Ratio (ref <=5.0)
VLDL: 27 mg/dL (ref <30)

## 2015-08-04 LAB — CBC WITH DIFFERENTIAL/PLATELET
BASOS ABS: 0 {cells}/uL (ref 0–200)
Basophils Relative: 0 %
Eosinophils Absolute: 414 cells/uL (ref 15–500)
Eosinophils Relative: 3 %
HEMATOCRIT: 43.1 % (ref 35.0–45.0)
HEMOGLOBIN: 14.6 g/dL (ref 11.7–15.5)
LYMPHS ABS: 2346 {cells}/uL (ref 850–3900)
LYMPHS PCT: 17 %
MCH: 29.1 pg (ref 27.0–33.0)
MCHC: 33.9 g/dL (ref 32.0–36.0)
MCV: 85.9 fL (ref 80.0–100.0)
MONO ABS: 552 {cells}/uL (ref 200–950)
MPV: 10.5 fL (ref 7.5–12.5)
Monocytes Relative: 4 %
NEUTROS PCT: 76 %
Neutro Abs: 10488 cells/uL — ABNORMAL HIGH (ref 1500–7800)
Platelets: 339 10*3/uL (ref 140–400)
RBC: 5.02 MIL/uL (ref 3.80–5.10)
RDW: 12.8 % (ref 11.0–15.0)
WBC: 13.8 10*3/uL — AB (ref 3.8–10.8)

## 2015-08-04 LAB — COMPREHENSIVE METABOLIC PANEL
ALBUMIN: 3.5 g/dL — AB (ref 3.6–5.1)
ALK PHOS: 53 U/L (ref 33–130)
ALT: 12 U/L (ref 6–29)
AST: 13 U/L (ref 10–35)
BILIRUBIN TOTAL: 0.4 mg/dL (ref 0.2–1.2)
BUN: 13 mg/dL (ref 7–25)
CO2: 23 mmol/L (ref 20–31)
CREATININE: 0.62 mg/dL (ref 0.50–1.05)
Calcium: 9 mg/dL (ref 8.6–10.4)
Chloride: 104 mmol/L (ref 98–110)
Glucose, Bld: 118 mg/dL — ABNORMAL HIGH (ref 65–99)
Potassium: 4.6 mmol/L (ref 3.5–5.3)
SODIUM: 138 mmol/L (ref 135–146)
TOTAL PROTEIN: 6.9 g/dL (ref 6.1–8.1)

## 2015-08-04 LAB — URINALYSIS W MICROSCOPIC + REFLEX CULTURE
BACTERIA UA: NONE SEEN [HPF]
BILIRUBIN URINE: NEGATIVE
CRYSTALS: NONE SEEN [HPF]
Casts: NONE SEEN [LPF]
GLUCOSE, UA: NEGATIVE
Hgb urine dipstick: NEGATIVE
KETONES UR: NEGATIVE
NITRITE: NEGATIVE
Protein, ur: NEGATIVE
RBC / HPF: NONE SEEN RBC/HPF (ref <=2)
SPECIFIC GRAVITY, URINE: 1.016 (ref 1.001–1.035)
Yeast: NONE SEEN [HPF]
pH: 6 (ref 5.0–8.0)

## 2015-08-04 LAB — FOLLICLE STIMULATING HORMONE: FSH: 1.9 m[IU]/mL

## 2015-08-04 LAB — HEMOGLOBIN A1C
Hgb A1c MFr Bld: 7.9 % — ABNORMAL HIGH (ref <5.7)
MEAN PLASMA GLUCOSE: 180 mg/dL

## 2015-08-04 MED ORDER — SERTRALINE HCL 50 MG PO TABS: ORAL_TABLET | ORAL | Status: DC

## 2015-08-04 MED ORDER — ALPRAZOLAM 0.25 MG PO TABS: 0.2500 mg | ORAL_TABLET | Freq: Every evening | ORAL | Status: DC | PRN

## 2015-08-04 MED ORDER — ETONOGESTREL-ETHINYL ESTRADIOL 0.12-0.015 MG/24HR VA RING: 1.0000 | VAGINAL_RING | VAGINAL | Status: DC

## 2015-08-04 NOTE — Patient Instructions (Signed)
Health Maintenance, Female Adopting a healthy lifestyle and getting preventive care can go a long way to promote health and wellness. Talk with your health care provider about what schedule of regular examinations is right for you. This is a good chance for you to check in with your provider about disease prevention and staying healthy. In between checkups, there are plenty of things you can do on your own. Experts have done a lot of research about which lifestyle changes and preventive measures are most likely to keep you healthy. Ask your health care provider for more information. WEIGHT AND DIET  Eat a healthy diet  Be sure to include plenty of vegetables, fruits, low-fat dairy products, and lean protein.  Do not eat a lot of foods high in solid fats, added sugars, or salt.  Get regular exercise. This is one of the most important things you can do for your health.  Most adults should exercise for at least 150 minutes each week. The exercise should increase your heart rate and make you sweat (moderate-intensity exercise).  Most adults should also do strengthening exercises at least twice a week. This is in addition to the moderate-intensity exercise.  Maintain a healthy weight  Body mass index (BMI) is a measurement that can be used to identify possible weight problems. It estimates body fat based on height and weight. Your health care provider can help determine your BMI and help you achieve or maintain a healthy weight.  For females 20 years of age and older:   A BMI below 18.5 is considered underweight.  A BMI of 18.5 to 24.9 is normal.  A BMI of 25 to 29.9 is considered overweight.  A BMI of 30 and above is considered obese.  Watch levels of cholesterol and blood lipids  You should start having your blood tested for lipids and cholesterol at 53 years of age, then have this test every 5 years.  You may need to have your cholesterol levels checked more often if:  Your lipid  or cholesterol levels are high.  You are older than 53 years of age.  You are at high risk for heart disease.  CANCER SCREENING   Lung Cancer  Lung cancer screening is recommended for adults 55-80 years old who are at high risk for lung cancer because of a history of smoking.  A yearly low-dose CT scan of the lungs is recommended for people who:  Currently smoke.  Have quit within the past 15 years.  Have at least a 30-pack-year history of smoking. A pack year is smoking an average of one pack of cigarettes a day for 1 year.  Yearly screening should continue until it has been 15 years since you quit.  Yearly screening should stop if you develop a health problem that would prevent you from having lung cancer treatment.  Breast Cancer  Practice breast self-awareness. This means understanding how your breasts normally appear and feel.  It also means doing regular breast self-exams. Let your health care provider know about any changes, no matter how small.  If you are in your 20s or 30s, you should have a clinical breast exam (CBE) by a health care provider every 1-3 years as part of a regular health exam.  If you are 40 or older, have a CBE every year. Also consider having a breast X-ray (mammogram) every year.  If you have a family history of breast cancer, talk to your health care provider about genetic screening.  If you   are at high risk for breast cancer, talk to your health care provider about having an MRI and a mammogram every year.  Breast cancer gene (BRCA) assessment is recommended for women who have family members with BRCA-related cancers. BRCA-related cancers include:  Breast.  Ovarian.  Tubal.  Peritoneal cancers.  Results of the assessment will determine the need for genetic counseling and BRCA1 and BRCA2 testing. Cervical Cancer Your health care provider may recommend that you be screened regularly for cancer of the pelvic organs (ovaries, uterus, and  vagina). This screening involves a pelvic examination, including checking for microscopic changes to the surface of your cervix (Pap test). You may be encouraged to have this screening done every 3 years, beginning at age 21.  For women ages 30-65, health care providers may recommend pelvic exams and Pap testing every 3 years, or they may recommend the Pap and pelvic exam, combined with testing for human papilloma virus (HPV), every 5 years. Some types of HPV increase your risk of cervical cancer. Testing for HPV may also be done on women of any age with unclear Pap test results.  Other health care providers may not recommend any screening for nonpregnant women who are considered low risk for pelvic cancer and who do not have symptoms. Ask your health care provider if a screening pelvic exam is right for you.  If you have had past treatment for cervical cancer or a condition that could lead to cancer, you need Pap tests and screening for cancer for at least 20 years after your treatment. If Pap tests have been discontinued, your risk factors (such as having a new sexual partner) need to be reassessed to determine if screening should resume. Some women have medical problems that increase the chance of getting cervical cancer. In these cases, your health care provider may recommend more frequent screening and Pap tests. Colorectal Cancer  This type of cancer can be detected and often prevented.  Routine colorectal cancer screening usually begins at 53 years of age and continues through 53 years of age.  Your health care provider may recommend screening at an earlier age if you have risk factors for colon cancer.  Your health care provider may also recommend using home test kits to check for hidden blood in the stool.  A small camera at the end of a tube can be used to examine your colon directly (sigmoidoscopy or colonoscopy). This is done to check for the earliest forms of colorectal  cancer.  Routine screening usually begins at age 50.  Direct examination of the colon should be repeated every 5-10 years through 53 years of age. However, you may need to be screened more often if early forms of precancerous polyps or small growths are found. Skin Cancer  Check your skin from head to toe regularly.  Tell your health care provider about any new moles or changes in moles, especially if there is a change in a mole's shape or color.  Also tell your health care provider if you have a mole that is larger than the size of a pencil eraser.  Always use sunscreen. Apply sunscreen liberally and repeatedly throughout the day.  Protect yourself by wearing long sleeves, pants, a wide-brimmed hat, and sunglasses whenever you are outside. HEART DISEASE, DIABETES, AND HIGH BLOOD PRESSURE   High blood pressure causes heart disease and increases the risk of stroke. High blood pressure is more likely to develop in:  People who have blood pressure in the high end   of the normal range (130-139/85-89 mm Hg).  People who are overweight or obese.  People who are African American.  If you are 38-23 years of age, have your blood pressure checked every 3-5 years. If you are 61 years of age or older, have your blood pressure checked every year. You should have your blood pressure measured twice--once when you are at a hospital or clinic, and once when you are not at a hospital or clinic. Record the average of the two measurements. To check your blood pressure when you are not at a hospital or clinic, you can use:  An automated blood pressure machine at a pharmacy.  A home blood pressure monitor.  If you are between 45 years and 39 years old, ask your health care provider if you should take aspirin to prevent strokes.  Have regular diabetes screenings. This involves taking a blood sample to check your fasting blood sugar level.  If you are at a normal weight and have a low risk for diabetes,  have this test once every three years after 53 years of age.  If you are overweight and have a high risk for diabetes, consider being tested at a younger age or more often. PREVENTING INFECTION  Hepatitis B  If you have a higher risk for hepatitis B, you should be screened for this virus. You are considered at high risk for hepatitis B if:  You were born in a country where hepatitis B is common. Ask your health care provider which countries are considered high risk.  Your parents were born in a high-risk country, and you have not been immunized against hepatitis B (hepatitis B vaccine).  You have HIV or AIDS.  You use needles to inject street drugs.  You live with someone who has hepatitis B.  You have had sex with someone who has hepatitis B.  You get hemodialysis treatment.  You take certain medicines for conditions, including cancer, organ transplantation, and autoimmune conditions. Hepatitis C  Blood testing is recommended for:  Everyone born from [birth year] through 1963.  Anyone with known risk factors for hepatitis C. Sexually transmitted infections (STIs)  You should be screened for sexually transmitted infections (STIs) including gonorrhea and chlamydia if:  You are sexually active and are younger than 53 years of age.  You are older than 53 years of age and your health care provider tells you that you are at risk for this type of infection.  Your sexual activity has changed since you were last screened and you are at an increased risk for chlamydia or gonorrhea. Ask your health care provider if you are at risk.  If you do not have HIV, but are at risk, it may be recommended that you take a prescription medicine daily to prevent HIV infection. This is called pre-exposure prophylaxis (PrEP). You are considered at risk if:  You are sexually active and do not regularly use condoms or know the HIV status of your partner(s).  You take drugs by injection.  You are sexually  active with a partner who has HIV. Talk with your health care provider about whether you are at high risk of being infected with HIV. If you choose to begin PrEP, you should first be tested for HIV. You should then be tested every 3 months for as long as you are taking PrEP.  PREGNANCY   If you are premenopausal and you may become pregnant, ask your health care provider about preconception counseling.  If you may  become pregnant, take 400 to 800 micrograms (mcg) of folic acid every day.  If you want to prevent pregnancy, talk to your health care provider about birth control (contraception). OSTEOPOROSIS AND MENOPAUSE   Osteoporosis is a disease in which the bones lose minerals and strength with aging. This can result in serious bone fractures. Your risk for osteoporosis can be identified using a bone density scan.  If you are 61 years of age or older, or if you are at risk for osteoporosis and fractures, ask your health care provider if you should be screened.  Ask your health care provider whether you should take a calcium or vitamin D supplement to lower your risk for osteoporosis.  Menopause may have certain physical symptoms and risks.  Hormone replacement therapy may reduce some of these symptoms and risks. Talk to your health care provider about whether hormone replacement therapy is right for you.  HOME CARE INSTRUCTIONS   Schedule regular health, dental, and eye exams.  Stay current with your immunizations.   Do not use any tobacco products including cigarettes, chewing tobacco, or electronic cigarettes.  If you are pregnant, do not drink alcohol.  If you are breastfeeding, limit how much and how often you drink alcohol.  Limit alcohol intake to no more than 1 drink per day for nonpregnant women. One drink equals 12 ounces of beer, 5 ounces of wine, or 1 ounces of hard liquor.  Do not use street drugs.  Do not share needles.  Ask your health care provider for help if  you need support or information about quitting drugs.  Tell your health care provider if you often feel depressed.  Tell your health care provider if you have ever been abused or do not feel safe at home.   This information is not intended to replace advice given to you by your health care provider. Make sure you discuss any questions you have with your health care provider.   Document Released: 10/10/2010 Document Revised: 04/17/2014 Document Reviewed: 02/26/2013 Elsevier Interactive Patient Education [employer].

## 2015-08-04 NOTE — Progress Notes (Signed)
Alexandria GriceMelissa Wheeler 05/13/1960 [id number]    History:    Presents for annual exam.  NuvaRing continuously to prevent migraines, no menopausal symptoms. History of benign colon polyps, 99, 2000, 2005, 2010, 03/2015 benign adenoma. Paternal uncle died from colon cancer. 2013 benign breast biopsy normal mammograms since. Normal Pap history. 2016 diagnosed with type 2 diabetes on Januvia and a basal insulin doing much better [place]. Had abdominal pain, had negative CT last month. Anxiety and depression stable on Zoloft.  Past medical history, past surgical history, family history and social history were all reviewed and documented in the EPIC chart. [place] [employer], completing [employer]. 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  ROS:  A ROS was performed and pertinent positives and negatives are included.  Exam:  Filed Vitals:   08/04/15 0759  BP: 128/80    General appearance:  Normal Thyroid:  Symmetrical, normal in size, without palpable masses or nodularity. Respiratory  Auscultation:  Clear without wheezing or rhonchi Cardiovascular  Auscultation:  Regular rate, without rubs, murmurs or gallops  Edema/varicosities:  Not grossly evident Abdominal  Soft,nontender, without masses, guarding or rebound.  Liver/spleen:  No organomegaly noted  Hernia:  None appreciated  Skin  Inspection:  Grossly normal   Breasts: Examined lying and sitting.     Right: Without masses, retractions, discharge or axillary adenopathy.     Left: Without masses, retractions, discharge or axillary adenopathy. Gentitourinary   Inguinal/mons:  Normal without inguinal adenopathy  External genitalia:  Normal  BUS/Urethra/Skene's glands:  Normal  Vagina:  Normal  Cervix:  Normal  Uterus:  normal in size, shape and contour.  Midline and mobile  Adnexa/parametria:     Rt: Without masses or tenderness.   Lt: Without masses or tenderness.  Anus and  perineum: Normal  Digital rectal exam: Normal sphincter tone without palpated masses or tenderness  Assessment/Plan:  55 y.o. MWF G0 for annual exam with no complaints.  NuvaRing continuously for migraine prevention Colon polyp history managed per GI Anxiety and depression stable on Zoloft 2016 Type 2 [place] [facility] [place] [facility]  Plan: NuvaRing prescription, proper use, risk for blood clots, strokes reviewed. SBE's, continue annual screening mammogram, exercise, calcium rich diet, vitamin D 1000 daily encouraged. Zoloft 50 mg by mouth daily prescription, proper use given and reviewed counseling as needed. Follow-up with GI as recommended last colonoscopy 03/2015. Xanax 0.25 prescription, proper use given and reviewed uses less than one prescription annually, aware of addictive properties. CBC, hemoglobin A1c, lipid panel, CMP, vitamin D, UA, Pap normal with negative HR HPV 2015 new screening guidelines reviewed.   Harrington ChallengerYOUNG,NANCY J [facility]WHNP, 8:28 AM 08/04/2015

## 2015-08-05 LAB — URINE CULTURE

## 2015-08-05 LAB — VITAMIN D 25 HYDROXY (VIT D DEFICIENCY, FRACTURES): Vit D, 25-Hydroxy: 40 ng/mL (ref 30–100)

## 2015-08-17 ENCOUNTER — Telehealth: Payer: Self-pay | Admitting: *Deleted

## 2015-08-17 MED ORDER — FLUCONAZOLE 150 MG PO TABS: ORAL_TABLET | ORAL | Status: DC

## 2015-08-17 NOTE — Telephone Encounter (Signed)
Dr.Fernandez I assume diflucan 150 mg is what you meant for the below?

## 2015-08-17 NOTE — Telephone Encounter (Signed)
Call in Diflucan 250 mg One PO qod x 2 days #2 with 2 refills

## 2015-08-17 NOTE — Telephone Encounter (Signed)
Yes thank you it was a typo error. Call in Diflucan 150 mg 1 by mouth every other day for 2 days #2

## 2015-08-17 NOTE — Telephone Encounter (Signed)
(  you are back up MD) Pt called c/o yeast infection itching and white discharge, type 1 diabetic has recurrent yeast at times. Pt asked if she could have 2 diflucan tablets, states 1 tablet doesn't help. Please advise

## 2015-08-17 NOTE — Telephone Encounter (Signed)
Left on voicemail on Rx sent.

## 2015-08-19 DIAGNOSIS — G43719 Chronic migraine without aura, intractable, without status migrainosus: Secondary | ICD-10-CM | POA: Diagnosis not present

## 2015-08-19 DIAGNOSIS — M542 Cervicalgia: Secondary | ICD-10-CM | POA: Diagnosis not present

## 2015-08-19 DIAGNOSIS — G518 Other disorders of facial nerve: Secondary | ICD-10-CM | POA: Diagnosis not present

## 2015-08-19 DIAGNOSIS — M791 Myalgia: Secondary | ICD-10-CM | POA: Diagnosis not present

## 2015-08-22 ENCOUNTER — Other Ambulatory Visit: Payer: Self-pay | Admitting: Women's Health

## 2015-08-24 DIAGNOSIS — J309 Allergic rhinitis, unspecified: Secondary | ICD-10-CM | POA: Diagnosis not present

## 2015-09-09 ENCOUNTER — Other Ambulatory Visit: Payer: Self-pay

## 2015-09-09 MED ORDER — FLUCONAZOLE 150 MG PO TABS: ORAL_TABLET | ORAL | Status: DC

## 2015-09-10 ENCOUNTER — Other Ambulatory Visit: Payer: Self-pay | Admitting: Gynecology

## 2015-09-10 ENCOUNTER — Telehealth: Payer: Self-pay | Admitting: *Deleted

## 2015-09-10 MED ORDER — ONDANSETRON HCL 4 MG PO TABS: ORAL_TABLET | ORAL | Status: DC

## 2015-09-10 NOTE — Telephone Encounter (Signed)
REfiil 4 with 3 refills

## 2015-09-10 NOTE — Telephone Encounter (Signed)
Rx sent to pharmacy. Left message home ph that Rx sent to pharmacy.

## 2015-09-10 NOTE — Telephone Encounter (Addendum)
(  you are back up md) Pt called requesting Rx Zofran 4 mg per note "Vertigo secondary to migraines, relieved with Meclizine and Zofran". Alexandria Wheeler has prescribed before.

## 2015-09-14 DIAGNOSIS — J309 Allergic rhinitis, unspecified: Secondary | ICD-10-CM | POA: Diagnosis not present

## 2015-10-01 DIAGNOSIS — G43719 Chronic migraine without aura, intractable, without status migrainosus: Secondary | ICD-10-CM | POA: Diagnosis not present

## 2015-10-07 DIAGNOSIS — J309 Allergic rhinitis, unspecified: Secondary | ICD-10-CM | POA: Diagnosis not present

## 2015-10-14 DIAGNOSIS — G43719 Chronic migraine without aura, intractable, without status migrainosus: Secondary | ICD-10-CM | POA: Diagnosis not present

## 2015-11-02 DIAGNOSIS — J309 Allergic rhinitis, unspecified: Secondary | ICD-10-CM | POA: Diagnosis not present

## 2015-11-18 DIAGNOSIS — M542 Cervicalgia: Secondary | ICD-10-CM | POA: Diagnosis not present

## 2015-11-18 DIAGNOSIS — G518 Other disorders of facial nerve: Secondary | ICD-10-CM | POA: Diagnosis not present

## 2015-11-18 DIAGNOSIS — G43719 Chronic migraine without aura, intractable, without status migrainosus: Secondary | ICD-10-CM | POA: Diagnosis not present

## 2015-11-18 DIAGNOSIS — M791 Myalgia: Secondary | ICD-10-CM | POA: Diagnosis not present

## 2015-11-23 DIAGNOSIS — J309 Allergic rhinitis, unspecified: Secondary | ICD-10-CM | POA: Diagnosis not present

## 2015-12-14 DIAGNOSIS — J309 Allergic rhinitis, unspecified: Secondary | ICD-10-CM | POA: Diagnosis not present

## 2015-12-22 DIAGNOSIS — Z23 Encounter for immunization: Secondary | ICD-10-CM | POA: Diagnosis not present

## 2015-12-24 DIAGNOSIS — G43719 Chronic migraine without aura, intractable, without status migrainosus: Secondary | ICD-10-CM | POA: Diagnosis not present

## 2015-12-28 DIAGNOSIS — K589 Irritable bowel syndrome without diarrhea: Secondary | ICD-10-CM | POA: Diagnosis not present

## 2015-12-28 DIAGNOSIS — F419 Anxiety disorder, unspecified: Secondary | ICD-10-CM | POA: Diagnosis not present

## 2015-12-28 DIAGNOSIS — Z7984 Long term (current) use of oral hypoglycemic drugs: Secondary | ICD-10-CM | POA: Diagnosis not present

## 2015-12-28 DIAGNOSIS — F329 Major depressive disorder, single episode, unspecified: Secondary | ICD-10-CM | POA: Diagnosis not present

## 2015-12-28 DIAGNOSIS — Z794 Long term (current) use of insulin: Secondary | ICD-10-CM | POA: Diagnosis not present

## 2015-12-28 DIAGNOSIS — G43809 Other migraine, not intractable, without status migrainosus: Secondary | ICD-10-CM | POA: Diagnosis not present

## 2015-12-28 DIAGNOSIS — R42 Dizziness and giddiness: Secondary | ICD-10-CM | POA: Diagnosis not present

## 2015-12-28 DIAGNOSIS — E119 Type 2 diabetes mellitus without complications: Secondary | ICD-10-CM | POA: Diagnosis not present

## 2015-12-28 DIAGNOSIS — E1142 Type 2 diabetes mellitus with diabetic polyneuropathy: Secondary | ICD-10-CM | POA: Diagnosis not present

## 2015-12-28 DIAGNOSIS — Z79899 Other long term (current) drug therapy: Secondary | ICD-10-CM | POA: Diagnosis not present

## 2015-12-28 DIAGNOSIS — Z86010 Personal history of colonic polyps: Secondary | ICD-10-CM | POA: Diagnosis not present

## 2016-01-04 DIAGNOSIS — J309 Allergic rhinitis, unspecified: Secondary | ICD-10-CM | POA: Diagnosis not present

## 2016-01-07 DIAGNOSIS — J309 Allergic rhinitis, unspecified: Secondary | ICD-10-CM | POA: Diagnosis not present

## 2016-01-13 DIAGNOSIS — G43719 Chronic migraine without aura, intractable, without status migrainosus: Secondary | ICD-10-CM | POA: Diagnosis not present

## 2016-01-26 DIAGNOSIS — J309 Allergic rhinitis, unspecified: Secondary | ICD-10-CM | POA: Diagnosis not present

## 2016-02-15 DIAGNOSIS — J309 Allergic rhinitis, unspecified: Secondary | ICD-10-CM | POA: Diagnosis not present

## 2016-02-28 DIAGNOSIS — G43719 Chronic migraine without aura, intractable, without status migrainosus: Secondary | ICD-10-CM | POA: Diagnosis not present

## 2016-02-28 DIAGNOSIS — M791 Myalgia: Secondary | ICD-10-CM | POA: Diagnosis not present

## 2016-02-28 DIAGNOSIS — G518 Other disorders of facial nerve: Secondary | ICD-10-CM | POA: Diagnosis not present

## 2016-02-28 DIAGNOSIS — M542 Cervicalgia: Secondary | ICD-10-CM | POA: Diagnosis not present

## 2016-03-07 DIAGNOSIS — J309 Allergic rhinitis, unspecified: Secondary | ICD-10-CM | POA: Diagnosis not present

## 2016-03-27 DIAGNOSIS — G43719 Chronic migraine without aura, intractable, without status migrainosus: Secondary | ICD-10-CM | POA: Diagnosis not present

## 2016-03-28 DIAGNOSIS — J309 Allergic rhinitis, unspecified: Secondary | ICD-10-CM | POA: Diagnosis not present

## 2016-04-06 DIAGNOSIS — G43719 Chronic migraine without aura, intractable, without status migrainosus: Secondary | ICD-10-CM | POA: Diagnosis not present

## 2016-05-04 DIAGNOSIS — J309 Allergic rhinitis, unspecified: Secondary | ICD-10-CM | POA: Diagnosis not present

## 2016-05-22 DIAGNOSIS — M542 Cervicalgia: Secondary | ICD-10-CM | POA: Diagnosis not present

## 2016-05-22 DIAGNOSIS — G518 Other disorders of facial nerve: Secondary | ICD-10-CM | POA: Diagnosis not present

## 2016-05-22 DIAGNOSIS — M791 Myalgia: Secondary | ICD-10-CM | POA: Diagnosis not present

## 2016-05-22 DIAGNOSIS — G43719 Chronic migraine without aura, intractable, without status migrainosus: Secondary | ICD-10-CM | POA: Diagnosis not present

## 2016-05-24 DIAGNOSIS — J309 Allergic rhinitis, unspecified: Secondary | ICD-10-CM | POA: Diagnosis not present

## 2016-06-07 DIAGNOSIS — Z1231 Encounter for screening mammogram for malignant neoplasm of breast: Secondary | ICD-10-CM | POA: Diagnosis not present

## 2016-06-14 DIAGNOSIS — J309 Allergic rhinitis, unspecified: Secondary | ICD-10-CM | POA: Diagnosis not present

## 2016-06-26 DIAGNOSIS — G43719 Chronic migraine without aura, intractable, without status migrainosus: Secondary | ICD-10-CM | POA: Diagnosis not present

## 2016-06-29 DIAGNOSIS — G43719 Chronic migraine without aura, intractable, without status migrainosus: Secondary | ICD-10-CM | POA: Diagnosis not present

## 2016-07-05 DIAGNOSIS — J309 Allergic rhinitis, unspecified: Secondary | ICD-10-CM | POA: Diagnosis not present

## 2016-07-26 DIAGNOSIS — J309 Allergic rhinitis, unspecified: Secondary | ICD-10-CM | POA: Diagnosis not present

## 2016-07-27 DIAGNOSIS — Z6829 Body mass index (BMI) 29.0-29.9, adult: Secondary | ICD-10-CM | POA: Diagnosis not present

## 2016-07-27 DIAGNOSIS — E119 Type 2 diabetes mellitus without complications: Secondary | ICD-10-CM | POA: Diagnosis not present

## 2016-08-15 DIAGNOSIS — J309 Allergic rhinitis, unspecified: Secondary | ICD-10-CM | POA: Diagnosis not present

## 2016-08-22 DIAGNOSIS — M542 Cervicalgia: Secondary | ICD-10-CM | POA: Diagnosis not present

## 2016-08-22 DIAGNOSIS — M791 Myalgia: Secondary | ICD-10-CM | POA: Diagnosis not present

## 2016-08-22 DIAGNOSIS — G43719 Chronic migraine without aura, intractable, without status migrainosus: Secondary | ICD-10-CM | POA: Diagnosis not present

## 2016-08-22 DIAGNOSIS — G518 Other disorders of facial nerve: Secondary | ICD-10-CM | POA: Diagnosis not present

## 2016-08-23 ENCOUNTER — Encounter: Payer: Self-pay | Admitting: Gynecology

## 2016-09-05 DIAGNOSIS — J309 Allergic rhinitis, unspecified: Secondary | ICD-10-CM | POA: Diagnosis not present

## 2016-09-11 DIAGNOSIS — J069 Acute upper respiratory infection, unspecified: Secondary | ICD-10-CM | POA: Diagnosis not present

## 2016-09-22 DIAGNOSIS — G43719 Chronic migraine without aura, intractable, without status migrainosus: Secondary | ICD-10-CM | POA: Diagnosis not present

## 2016-09-26 DIAGNOSIS — J309 Allergic rhinitis, unspecified: Secondary | ICD-10-CM | POA: Diagnosis not present

## 2016-09-28 ENCOUNTER — Other Ambulatory Visit: Payer: Self-pay | Admitting: Gynecology

## 2016-09-29 ENCOUNTER — Other Ambulatory Visit: Payer: Self-pay | Admitting: Women's Health

## 2016-09-29 DIAGNOSIS — Z3040 Encounter for surveillance of contraceptives, unspecified: Secondary | ICD-10-CM

## 2016-09-29 NOTE — Telephone Encounter (Signed)
CE scheduled 10/03/16 at 8:45am.

## 2016-10-03 ENCOUNTER — Ambulatory Visit (INDEPENDENT_AMBULATORY_CARE_PROVIDER_SITE_OTHER): Payer: BC Managed Care – PPO | Admitting: Women's Health

## 2016-10-03 ENCOUNTER — Encounter: Payer: Self-pay | Admitting: Women's Health

## 2016-10-03 ENCOUNTER — Encounter: Payer: BC Managed Care – PPO | Admitting: Women's Health

## 2016-10-03 VITALS — BP 132/80 | Ht 66.25 in | Wt 181.0 lb

## 2016-10-03 DIAGNOSIS — B373 Candidiasis of vulva and vagina: Secondary | ICD-10-CM

## 2016-10-03 DIAGNOSIS — Z01419 Encounter for gynecological examination (general) (routine) without abnormal findings: Secondary | ICD-10-CM

## 2016-10-03 DIAGNOSIS — Z1151 Encounter for screening for human papillomavirus (HPV): Secondary | ICD-10-CM | POA: Diagnosis not present

## 2016-10-03 DIAGNOSIS — Z79890 Hormone replacement therapy (postmenopausal): Secondary | ICD-10-CM

## 2016-10-03 DIAGNOSIS — B3731 Acute candidiasis of vulva and vagina: Secondary | ICD-10-CM

## 2016-10-03 MED ORDER — FLUCONAZOLE 150 MG PO TABS: 150.0000 mg | ORAL_TABLET | Freq: Once | ORAL | 1 refills | Status: AC

## 2016-10-03 MED ORDER — NORETHINDRONE-ETH ESTRADIOL 1-5 MG-MCG PO TABS: 1.0000 | ORAL_TABLET | Freq: Every day | ORAL | 4 refills | Status: DC

## 2016-10-03 NOTE — Progress Notes (Signed)
Alexandria GriceMelissa Wheeler 08/01/1960 [id number]    History:    Presents for annual exam.  Uses NuvaRing continuously to prevent migraines is having numerous hot flushes. Normal Pap history. 2013 benign breast biopsy with normal mammograms after. Has had some abdominal pain negative CT scan 06/2015. History of colon polyps, first colonoscopy 1999, last 112/2016, benign polyp 5 year follow-up, paternal uncle died of colon cancer. Anxiety and depression stable on Zoloft. Type 2 diabetes. History of allergies..  Past medical history, past surgical history, family history and social history were all reviewed and documented in the EPIC chart. [place] [employer], finished [employer]. 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  ROS:  A ROS was performed and pertinent positives and negatives are included.  Exam:  Vitals:   10/03/16 0907  Weight: 181 lb (82.1 kg)  Height: 5' 6.25" (1.683 m)   Body mass index is 28.99 kg/m.   General appearance:  Normal Thyroid:  Symmetrical, normal in size, without palpable masses or nodularity. Respiratory  Auscultation:  Clear without wheezing or rhonchi Cardiovascular  Auscultation:  Regular rate, without rubs, murmurs or gallops  Edema/varicosities:  Not grossly evident Abdominal  Soft,nontender, without masses, guarding or rebound.  Liver/spleen:  No organomegaly noted  Hernia:  None appreciated  Skin  Inspection:  Grossly normal   Breasts: Examined lying and sitting.     Right: Without masses, retractions, discharge or axillary adenopathy.     Left: Without masses, retractions, discharge or axillary adenopathy. Gentitourinary   Inguinal/mons:  Normal without inguinal adenopathy  External genitalia:  Normal  BUS/Urethra/Skene's glands:  Normal  Vagina:  Normal  Cervix:  Normal  Uterus:   normal in size, shape and contour.  Midline and mobile  Adnexa/parametria:     Rt: Without masses or tenderness.   Lt: Without masses or tenderness.  Anus and  perineum: Normal  Digital rectal exam: Normal sphincter tone without palpated masses or tenderness  Assessment/Plan:  56 y.o. MWF G0 for annual exam with hot flushes and vaginal itching.  NuvaRing continuously to prevent migraines Diabetes-primary care manages labs and meds Anxiety and depression-counseling stable on Zoloft  Plan: Options reviewed will try FEM HRT 1/5, will finish out current NuvaRing prescription. Instructed to call if  problems with the change. SBE's, continue annual 3-D screening mammogram, exercise, calcium rich diet, vitamin D 2000 daily encouraged. Pap normal 2015, Pap with HR HPV typing today. New screening guidelines reviewed. Minimal discharge noted, prescription for Diflucan 150 times one dose instructed to call if no relief of symptoms.    Harrington Challengerancy J Krysten Veronica [facility]WHNP, 9:09 AM 10/03/2016

## 2016-10-03 NOTE — Patient Instructions (Signed)
Health Maintenance, Female Adopting a healthy lifestyle and getting preventive care can go a long way to promote health and wellness. Talk with your health care provider about what schedule of regular examinations is right for you. This is a good chance for you to check in with your provider about disease prevention and staying healthy. In between checkups, there are plenty of things you can do on your own. Experts have done a lot of research about which lifestyle changes and preventive measures are most likely to keep you healthy. Ask your health care provider for more information. Weight and diet Eat a healthy diet  Be sure to include plenty of vegetables, fruits, low-fat dairy products, and lean protein.  Do not eat a lot of foods high in solid fats, added sugars, or salt.  Get regular exercise. This is one of the most important things you can do for your health. ? Most adults should exercise for at least 150 minutes each week. The exercise should increase your heart rate and make you sweat (moderate-intensity exercise). ? Most adults should also do strengthening exercises at least twice a week. This is in addition to the moderate-intensity exercise.  Maintain a healthy weight  Body mass index (BMI) is a measurement that can be used to identify possible weight problems. It estimates body fat based on height and weight. Your health care provider can help determine your BMI and help you achieve or maintain a healthy weight.  For females 20 years of age and older: ? A BMI below 18.5 is considered underweight. ? A BMI of 18.5 to 24.9 is normal. ? A BMI of 25 to 29.9 is considered overweight. ? A BMI of 30 and above is considered obese.  Watch levels of cholesterol and blood lipids  You should start having your blood tested for lipids and cholesterol at 54 years of age, then have this test every 5 years.  You may need to have your cholesterol levels checked more often if: ? Your lipid or  cholesterol levels are high. ? You are older than 54 years of age. ? You are at high risk for heart disease.  Cancer screening Lung Cancer  Lung cancer screening is recommended for adults 55-80 years old who are at high risk for lung cancer because of a history of smoking.  A yearly low-dose CT scan of the lungs is recommended for people who: ? Currently smoke. ? Have quit within the past 15 years. ? Have at least a 30-pack-year history of smoking. A pack year is smoking an average of one pack of cigarettes a day for 1 year.  Yearly screening should continue until it has been 15 years since you quit.  Yearly screening should stop if you develop a health problem that would prevent you from having lung cancer treatment.  Breast Cancer  Practice breast self-awareness. This means understanding how your breasts normally appear and feel.  It also means doing regular breast self-exams. Let your health care provider know about any changes, no matter how small.  If you are in your 20s or 30s, you should have a clinical breast exam (CBE) by a health care provider every 1-3 years as part of a regular health exam.  If you are 40 or older, have a CBE every year. Also consider having a breast X-ray (mammogram) every year.  If you have a family history of breast cancer, talk to your health care provider about genetic screening.  If you are at high risk   for breast cancer, talk to your health care provider about having an MRI and a mammogram every year.  Breast cancer gene (BRCA) assessment is recommended for women who have family members with BRCA-related cancers. BRCA-related cancers include: ? Breast. ? Ovarian. ? Tubal. ? Peritoneal cancers.  Results of the assessment will determine the need for genetic counseling and BRCA1 and BRCA2 testing.  Cervical Cancer Your health care provider may recommend that you be screened regularly for cancer of the pelvic organs (ovaries, uterus, and  vagina). This screening involves a pelvic examination, including checking for microscopic changes to the surface of your cervix (Pap test). You may be encouraged to have this screening done every 3 years, beginning at age 22.  For women ages 56-65, health care providers may recommend pelvic exams and Pap testing every 3 years, or they may recommend the Pap and pelvic exam, combined with testing for human papilloma virus (HPV), every 5 years. Some types of HPV increase your risk of cervical cancer. Testing for HPV may also be done on women of any age with unclear Pap test results.  Other health care providers may not recommend any screening for nonpregnant women who are considered low risk for pelvic cancer and who do not have symptoms. Ask your health care provider if a screening pelvic exam is right for you.  If you have had past treatment for cervical cancer or a condition that could lead to cancer, you need Pap tests and screening for cancer for at least 20 years after your treatment. If Pap tests have been discontinued, your risk factors (such as having a new sexual partner) need to be reassessed to determine if screening should resume. Some women have medical problems that increase the chance of getting cervical cancer. In these cases, your health care provider may recommend more frequent screening and Pap tests.  Colorectal Cancer  This type of cancer can be detected and often prevented.  Routine colorectal cancer screening usually begins at 54 years of age and continues through 54 years of age.  Your health care provider may recommend screening at an earlier age if you have risk factors for colon cancer.  Your health care provider may also recommend using home test kits to check for hidden blood in the stool.  A small camera at the end of a tube can be used to examine your colon directly (sigmoidoscopy or colonoscopy). This is done to check for the earliest forms of colorectal  cancer.  Routine screening usually begins at age 33.  Direct examination of the colon should be repeated every 5-10 years through 54 years of age. However, you may need to be screened more often if early forms of precancerous polyps or small growths are found.  Skin Cancer  Check your skin from head to toe regularly.  Tell your health care provider about any new moles or changes in moles, especially if there is a change in a mole's shape or color.  Also tell your health care provider if you have a mole that is larger than the size of a pencil eraser.  Always use sunscreen. Apply sunscreen liberally and repeatedly throughout the day.  Protect yourself by wearing long sleeves, pants, a wide-brimmed hat, and sunglasses whenever you are outside.  Heart disease, diabetes, and high blood pressure  High blood pressure causes heart disease and increases the risk of stroke. High blood pressure is more likely to develop in: ? People who have blood pressure in the high end of  the normal range (130-139/85-89 mm Hg). ? People who are overweight or obese. ? People who are African American.  If you are 21-29 years of age, have your blood pressure checked every 3-5 years. If you are 3 years of age or older, have your blood pressure checked every year. You should have your blood pressure measured twice-once when you are at a hospital or clinic, and once when you are not at a hospital or clinic. Record the average of the two measurements. To check your blood pressure when you are not at a hospital or clinic, you can use: ? An automated blood pressure machine at a pharmacy. ? A home blood pressure monitor.  If you are between 17 years and 37 years old, ask your health care provider if you should take aspirin to prevent strokes.  Have regular diabetes screenings. This involves taking a blood sample to check your fasting blood sugar level. ? If you are at a normal weight and have a low risk for diabetes,  have this test once every three years after 54 years of age. ? If you are overweight and have a high risk for diabetes, consider being tested at a younger age or more often. Preventing infection Hepatitis B  If you have a higher risk for hepatitis B, you should be screened for this virus. You are considered at high risk for hepatitis B if: ? You were born in a country where hepatitis B is common. Ask your health care provider which countries are considered high risk. ? Your parents were born in a high-risk country, and you have not been immunized against hepatitis B (hepatitis B vaccine). ? You have HIV or AIDS. ? You use needles to inject street drugs. ? You live with someone who has hepatitis B. ? You have had sex with someone who has hepatitis B. ? You get hemodialysis treatment. ? You take certain medicines for conditions, including cancer, organ transplantation, and autoimmune conditions.  Hepatitis C  Blood testing is recommended for: ? Everyone born from [birth year] through 1963. ? Anyone with known risk factors for hepatitis C.  Sexually transmitted infections (STIs)  You should be screened for sexually transmitted infections (STIs) including gonorrhea and chlamydia if: ? You are sexually active and are younger than 54 years of age. ? You are older than 54 years of age and your health care provider tells you that you are at risk for this type of infection. ? Your sexual activity has changed since you were last screened and you are at an increased risk for chlamydia or gonorrhea. Ask your health care provider if you are at risk.  If you do not have HIV, but are at risk, it may be recommended that you take a prescription medicine daily to prevent HIV infection. This is called pre-exposure prophylaxis (PrEP). You are considered at risk if: ? You are sexually active and do not regularly use condoms or know the HIV status of your partner(s). ? You take drugs by injection. ? You are  sexually active with a partner who has HIV.  Talk with your health care provider about whether you are at high risk of being infected with HIV. If you choose to begin PrEP, you should first be tested for HIV. You should then be tested every 3 months for as long as you are taking PrEP. Pregnancy  If you are premenopausal and you may become pregnant, ask your health care provider about preconception counseling.  If you may become  pregnant, take 400 to 800 micrograms (mcg) of folic acid every day.  If you want to prevent pregnancy, talk to your health care provider about birth control (contraception). Osteoporosis and menopause  Osteoporosis is a disease in which the bones lose minerals and strength with aging. This can result in serious bone fractures. Your risk for osteoporosis can be identified using a bone density scan.  If you are 65 years of age or older, or if you are at risk for osteoporosis and fractures, ask your health care provider if you should be screened.  Ask your health care provider whether you should take a calcium or vitamin D supplement to lower your risk for osteoporosis.  Menopause may have certain physical symptoms and risks.  Hormone replacement therapy may reduce some of these symptoms and risks. Talk to your health care provider about whether hormone replacement therapy is right for you. Follow these instructions at home:  Schedule regular health, dental, and eye exams.  Stay current with your immunizations.  Do not use any tobacco products including cigarettes, chewing tobacco, or electronic cigarettes.  If you are pregnant, do not drink alcohol.  If you are breastfeeding, limit how much and how often you drink alcohol.  Limit alcohol intake to no more than 1 drink per day for nonpregnant women. One drink equals 12 ounces of beer, 5 ounces of wine, or 1 ounces of hard liquor.  Do not use street drugs.  Do not share needles.  Ask your health care  provider for help if you need support or information about quitting drugs.  Tell your health care provider if you often feel depressed.  Tell your health care provider if you have ever been abused or do not feel safe at home. This information is not intended to replace advice given to you by your health care provider. Make sure you discuss any questions you have with your health care provider. Document Released: 10/10/2010 Document Revised: 09/02/2015 Document Reviewed: 12/29/2014 Elsevier Interactive Patient Education  2018 [employer] Carbohydrate Counting for Diabetes Mellitus, Adult Carbohydrate counting is a method for keeping track of how many carbohydrates you eat. Eating carbohydrates naturally increases the amount of sugar (glucose) in the blood. Counting how many carbohydrates you eat helps keep your blood glucose within normal limits, which helps you manage your diabetes (diabetes mellitus). It is important to know how many carbohydrates you can safely have in each meal. This is different for every person. A diet and nutrition specialist (registered dietitian) can help you make a meal plan and calculate how many carbohydrates you should have at each meal and snack. Carbohydrates are found in the following foods:  Grains, such as breads and cereals.  Dried beans and soy products.  Starchy vegetables, such as potatoes, peas, and corn.  Fruit and fruit juices.  Milk and yogurt.  Sweets and snack foods, such as cake, cookies, candy, chips, and soft drinks.  How do I count carbohydrates? There are two ways to count carbohydrates in food. You can use either of the methods or a combination of both. Reading "Nutrition Facts" on packaged food The "Nutrition Facts" list is included on the labels of almost all packaged foods and beverages in the [place] It includes:  The serving size.  Information about nutrients in each serving, including the grams (g) of carbohydrate per  serving.  To use the "Nutrition Facts":  Decide how many servings you will have.  Multiply the number of servings by the number of carbohydrates   per serving.  The resulting number is the total amount of carbohydrates that you will be having.  Learning standard serving sizes of other foods When you eat foods containing carbohydrates that are not packaged or do not include "Nutrition Facts" on the label, you need to measure the servings in order to count the amount of carbohydrates:  Measure the foods that you will eat with a food scale or measuring cup, if needed.  Decide how many standard-size servings you will eat.  Multiply the number of servings by 15. Most carbohydrate-rich foods have about 15 g of carbohydrates per serving. ? For example, if you eat 8 oz (170 g) of strawberries, you will have eaten 2 servings and 30 g of carbohydrates (2 servings x 15 g = 30 g).  For foods that have more than one food mixed, such as soups and casseroles, you must count the carbohydrates in each food that is included.  The following list contains standard serving sizes of common carbohydrate-rich foods. Each of these servings has about 15 g of carbohydrates:   hamburger bun or  English muffin.   oz (15 mL) syrup.   oz (14 g) jelly.  1 slice of bread.  1 six-inch tortilla.  3 oz (85 g) cooked rice or pasta.  4 oz (113 g) cooked dried beans.  4 oz (113 g) starchy vegetable, such as peas, corn, or potatoes.  4 oz (113 g) hot cereal.  4 oz (113 g) mashed potatoes or  of a large baked potato.  4 oz (113 g) canned or frozen fruit.  4 oz (120 mL) fruit juice.  4-6 crackers.  6 chicken nuggets.  6 oz (170 g) unsweetened dry cereal.  6 oz (170 g) plain fat-free yogurt or yogurt sweetened with artificial sweeteners.  8 oz (240 mL) milk.  8 oz (170 g) fresh fruit or one small piece of fruit.  24 oz (680 g) popped popcorn.  Example of carbohydrate counting Sample meal  3  oz (85 g) chicken breast.  6 oz (170 g) brown rice.  4 oz (113 g) corn.  8 oz (240 mL) milk.  8 oz (170 g) strawberries with sugar-free whipped topping. Carbohydrate calculation 1. Identify the foods that contain carbohydrates: ? Rice. ? Corn. ? Milk. ? Strawberries. 2. Calculate how many servings you have of each food: ? 2 servings rice. ? 1 serving corn. ? 1 serving milk. ? 1 serving strawberries. 3. Multiply each number of servings by 15 g: ? 2 servings rice x 15 g = 30 g. ? 1 serving corn x 15 g = 15 g. ? 1 serving milk x 15 g = 15 g. ? 1 serving strawberries x 15 g = 15 g. 4. Add together all of the amounts to find the total grams of carbohydrates eaten: ? 30 g + 15 g + 15 g + 15 g = 75 g of carbohydrates total. This information is not intended to replace advice given to you by your health care provider. Make sure you discuss any questions you have with your health care provider. Document Released: 03/27/2005 Document Revised: 10/15/2015 Document Reviewed: 09/08/2015 Elsevier Interactive Patient Education  [employer].

## 2016-10-04 ENCOUNTER — Encounter: Payer: BC Managed Care – PPO | Admitting: Women's Health

## 2016-10-04 DIAGNOSIS — G518 Other disorders of facial nerve: Secondary | ICD-10-CM | POA: Diagnosis not present

## 2016-10-04 DIAGNOSIS — M791 Myalgia: Secondary | ICD-10-CM | POA: Diagnosis not present

## 2016-10-04 DIAGNOSIS — M542 Cervicalgia: Secondary | ICD-10-CM | POA: Diagnosis not present

## 2016-10-04 DIAGNOSIS — G43719 Chronic migraine without aura, intractable, without status migrainosus: Secondary | ICD-10-CM | POA: Diagnosis not present

## 2016-10-05 LAB — PAP, TP IMAGING W/ HPV RNA, RFLX HPV TYPE 16,18/45: HPV MRNA, HIGH RISK: NOT DETECTED

## 2016-10-11 ENCOUNTER — Other Ambulatory Visit: Payer: Self-pay | Admitting: Women's Health

## 2016-10-11 DIAGNOSIS — F32A Depression, unspecified: Secondary | ICD-10-CM

## 2016-10-11 DIAGNOSIS — F329 Major depressive disorder, single episode, unspecified: Secondary | ICD-10-CM

## 2016-10-12 ENCOUNTER — Other Ambulatory Visit: Payer: Self-pay

## 2016-10-17 DIAGNOSIS — J309 Allergic rhinitis, unspecified: Secondary | ICD-10-CM | POA: Diagnosis not present

## 2016-10-27 DIAGNOSIS — E119 Type 2 diabetes mellitus without complications: Secondary | ICD-10-CM | POA: Diagnosis not present

## 2016-10-27 DIAGNOSIS — Z6829 Body mass index (BMI) 29.0-29.9, adult: Secondary | ICD-10-CM | POA: Diagnosis not present

## 2016-10-27 DIAGNOSIS — Z794 Long term (current) use of insulin: Secondary | ICD-10-CM | POA: Diagnosis not present

## 2016-11-07 DIAGNOSIS — J309 Allergic rhinitis, unspecified: Secondary | ICD-10-CM | POA: Diagnosis not present

## 2016-11-15 DIAGNOSIS — G43719 Chronic migraine without aura, intractable, without status migrainosus: Secondary | ICD-10-CM | POA: Diagnosis not present

## 2016-11-15 DIAGNOSIS — M542 Cervicalgia: Secondary | ICD-10-CM | POA: Diagnosis not present

## 2016-11-15 DIAGNOSIS — G518 Other disorders of facial nerve: Secondary | ICD-10-CM | POA: Diagnosis not present

## 2016-11-15 DIAGNOSIS — M791 Myalgia: Secondary | ICD-10-CM | POA: Diagnosis not present

## 2016-11-29 DIAGNOSIS — J309 Allergic rhinitis, unspecified: Secondary | ICD-10-CM | POA: Diagnosis not present

## 2016-12-13 ENCOUNTER — Encounter: Payer: Self-pay | Admitting: Women's Health

## 2016-12-13 ENCOUNTER — Ambulatory Visit (INDEPENDENT_AMBULATORY_CARE_PROVIDER_SITE_OTHER): Payer: BC Managed Care – PPO | Admitting: Women's Health

## 2016-12-13 VITALS — BP 124/80 | Ht 66.0 in | Wt 181.0 lb

## 2016-12-13 DIAGNOSIS — B3731 Acute candidiasis of vulva and vagina: Secondary | ICD-10-CM

## 2016-12-13 DIAGNOSIS — B009 Herpesviral infection, unspecified: Secondary | ICD-10-CM | POA: Diagnosis not present

## 2016-12-13 DIAGNOSIS — Z23 Encounter for immunization: Secondary | ICD-10-CM | POA: Diagnosis not present

## 2016-12-13 DIAGNOSIS — B373 Candidiasis of vulva and vagina: Secondary | ICD-10-CM | POA: Diagnosis not present

## 2016-12-13 DIAGNOSIS — R300 Dysuria: Secondary | ICD-10-CM | POA: Diagnosis not present

## 2016-12-13 LAB — WET PREP FOR TRICH, YEAST, CLUE

## 2016-12-13 MED ORDER — VALACYCLOVIR HCL 500 MG PO TABS: 500.0000 mg | ORAL_TABLET | Freq: Two times a day (BID) | ORAL | 12 refills | Status: DC

## 2016-12-13 MED ORDER — TERCONAZOLE 0.8 % VA CREA: 1.0000 | TOPICAL_CREAM | Freq: Every day | VAGINAL | 0 refills | Status: AC

## 2016-12-13 NOTE — Progress Notes (Signed)
Presents with symptoms of perineal pain and increased urination for one week. She confirms pain is present when wiping with toilet paper  and occasionally when she is sitting. The pain is described as 'soreness' and is not continuous .  Denies pain with urination or abdominal pain.  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 Took one Diflucan 150 with no relief of symptoms. Postmenopausal on HRT with no bleeding. Medical history significant for migraines and type 2 diabetes on insulin.  Exam: Appears well. External genitalia erythematous at introitus, speculum exam vaginal walls erythematous with minimal discharge, wet prep negative.  No visible herpatic lesions. Bimanual exam, no CMT, adnexal tenderness.   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 Prescription, proper use given. Instructed to call for no relief. Urine culture pending. Yeast prevention discussed. Refill of Valtrex 500 twice daily for 3-5 days given with instructions to start.

## 2016-12-13 NOTE — Patient Instructions (Signed)

## 2016-12-15 ENCOUNTER — Encounter: Payer: Self-pay | Admitting: Women's Health

## 2016-12-15 LAB — URINALYSIS W MICROSCOPIC + REFLEX CULTURE
Bacteria, UA: NONE SEEN /HPF
Bilirubin Urine: NEGATIVE
Casts: NONE SEEN /LPF
Crystals: NONE SEEN /HPF
Glucose, UA: NEGATIVE
Hgb urine dipstick: NEGATIVE
KETONES UR: NEGATIVE
Nitrites, Initial: NEGATIVE
PH: 5.5 (ref 5.0–8.0)
Protein, ur: NEGATIVE
RBC / HPF: NONE SEEN /HPF (ref 0–2)
SPECIFIC GRAVITY, URINE: 1.02 (ref 1.001–1.03)
YEAST: NONE SEEN /HPF

## 2016-12-15 LAB — CULTURE INDICATED

## 2016-12-15 LAB — URINE CULTURE
MICRO NUMBER:: 80978724
SPECIMEN QUALITY:: ADEQUATE

## 2016-12-20 DIAGNOSIS — J309 Allergic rhinitis, unspecified: Secondary | ICD-10-CM | POA: Diagnosis not present

## 2016-12-21 DIAGNOSIS — J309 Allergic rhinitis, unspecified: Secondary | ICD-10-CM | POA: Diagnosis not present

## 2016-12-25 DIAGNOSIS — G43719 Chronic migraine without aura, intractable, without status migrainosus: Secondary | ICD-10-CM | POA: Diagnosis not present

## 2016-12-26 ENCOUNTER — Telehealth: Payer: Self-pay | Admitting: *Deleted

## 2016-12-26 NOTE — Telephone Encounter (Signed)
Pt called to update with new Femhrt states she had a cycle last Thursday (11/20/16) lasted for 4 days, cramping, said she felt bad on RX.  No bleeding now, pt said last cycle before this bleeding was in Oct. 2017.

## 2016-12-26 NOTE — Telephone Encounter (Signed)
Telephone call, states had less hot flashes and felt good until the cycle started, had a headache, cramping. Had been on NuvaRing continuously for migraine prevention prior with no cycles. Will watch at this time try for another month and call if problems. States didn't do well other than 4 days of bleeding.

## 2017-01-02 ENCOUNTER — Telehealth: Payer: Self-pay | Admitting: *Deleted

## 2017-01-02 NOTE — Telephone Encounter (Signed)
Patient called c/o sinus infection drainage, headache (took migraine Rx) no relief, ear pain. Pt asked if you would be willing to send in Rx for Augmentin? Or seek treatment at Urgent care? Please advise

## 2017-01-02 NOTE — Telephone Encounter (Signed)
Best to go to an urgent care or primary, she is diabetic on insulin.

## 2017-01-02 NOTE — Telephone Encounter (Signed)
Pt informed

## 2017-01-08 DIAGNOSIS — M791 Myalgia, unspecified site: Secondary | ICD-10-CM | POA: Diagnosis not present

## 2017-01-08 DIAGNOSIS — G43719 Chronic migraine without aura, intractable, without status migrainosus: Secondary | ICD-10-CM | POA: Diagnosis not present

## 2017-01-08 DIAGNOSIS — M542 Cervicalgia: Secondary | ICD-10-CM | POA: Diagnosis not present

## 2017-01-08 DIAGNOSIS — G518 Other disorders of facial nerve: Secondary | ICD-10-CM | POA: Diagnosis not present

## 2017-01-16 DIAGNOSIS — J309 Allergic rhinitis, unspecified: Secondary | ICD-10-CM | POA: Diagnosis not present

## 2017-01-19 ENCOUNTER — Telehealth: Payer: Self-pay

## 2017-01-19 NOTE — Telephone Encounter (Signed)
Patient said she recently d/c'd Nuvaring and went on HRT.  She said last time she spoke with you she had unexpected bleeding. She said you advised she watch things over the next mos and report if any sx.  Patient has not had anymore bleeding but the last couple of days her "nerves are bad again". "Extremely nervous like the PMDD but I have not had that in a long time."  Reports feeling depressed, feels like crying. Did not feel like going to work today and is at home from work.  Patient wanted to see what you recommend?

## 2017-01-19 NOTE — Telephone Encounter (Signed)
Message left

## 2017-01-23 MED ORDER — ALPRAZOLAM 0.25 MG PO TABS: 0.2500 mg | ORAL_TABLET | Freq: Every evening | ORAL | 0 refills | Status: DC | PRN

## 2017-01-23 NOTE — Telephone Encounter (Signed)
Rx called in 

## 2017-01-23 NOTE — Telephone Encounter (Signed)
TC, states it was odd feeling last Friday, symptoms better now, states did take a Xanax that did help, prescription is over 54 years old. Requested new refill of Xanax 0.25 to be called into the [facility] on [address]. Will watch for now.  Victorino Dike, please call in a new refill of Xanax

## 2017-02-06 DIAGNOSIS — J309 Allergic rhinitis, unspecified: Secondary | ICD-10-CM | POA: Diagnosis not present

## 2017-02-20 DIAGNOSIS — M791 Myalgia, unspecified site: Secondary | ICD-10-CM | POA: Diagnosis not present

## 2017-02-20 DIAGNOSIS — G43719 Chronic migraine without aura, intractable, without status migrainosus: Secondary | ICD-10-CM | POA: Diagnosis not present

## 2017-02-20 DIAGNOSIS — M542 Cervicalgia: Secondary | ICD-10-CM | POA: Diagnosis not present

## 2017-02-20 DIAGNOSIS — G518 Other disorders of facial nerve: Secondary | ICD-10-CM | POA: Diagnosis not present

## 2017-02-27 DIAGNOSIS — J309 Allergic rhinitis, unspecified: Secondary | ICD-10-CM | POA: Diagnosis not present

## 2017-03-27 DIAGNOSIS — J309 Allergic rhinitis, unspecified: Secondary | ICD-10-CM | POA: Diagnosis not present

## 2017-04-17 DIAGNOSIS — J309 Allergic rhinitis, unspecified: Secondary | ICD-10-CM | POA: Diagnosis not present

## 2017-04-23 ENCOUNTER — Telehealth: Payer: Self-pay | Admitting: *Deleted

## 2017-04-23 DIAGNOSIS — Z889 Allergy status to unspecified drugs, medicaments and biological substances status: Secondary | ICD-10-CM

## 2017-04-23 DIAGNOSIS — J302 Other seasonal allergic rhinitis: Secondary | ICD-10-CM

## 2017-04-23 MED ORDER — CETIRIZINE HCL 10 MG PO TABS: 10.0000 mg | ORAL_TABLET | Freq: Every day | ORAL | 3 refills | Status: AC

## 2017-04-23 MED ORDER — PSEUDOEPHEDRINE HCL 30 MG PO TABS: 30.0000 mg | ORAL_TABLET | ORAL | 3 refills | Status: AC | PRN

## 2017-04-23 NOTE — Telephone Encounter (Signed)
Okay do you want refills on Rx's?

## 2017-04-23 NOTE — Telephone Encounter (Signed)
Rx printed , I called and asked pt to call me to confirm same address in epic.

## 2017-04-23 NOTE — Telephone Encounter (Signed)
Correct address in epic,Rx mailed.

## 2017-04-23 NOTE — Telephone Encounter (Signed)
Yes, write prescriptions for #90 with 3 refills.

## 2017-04-23 NOTE — Telephone Encounter (Signed)
Okay for both, she works at [employer] and is aware of how to take both. Thanks

## 2017-04-23 NOTE — Telephone Encounter (Signed)
Patient called stating you typically write 2 Rx's for her 1. Sudafed 1200 mg and 2. Zyrtec 10 mg tablets for her yearly. I see the Zyrtec in medication list, but no the Sudafed. Pt said cheaper with flex spending account. Annual exam due in June 2019. If you approve Rx pt would like them printed and mailed to her. Please advise

## 2017-04-24 IMAGING — CT CT ABD-PELV W/ CM
1 of 3 series · 14 of 32 positions shown, 19 images · IV contrast (omnipaque)
Comparison: 08/27/2013

CLINICAL DATA: Lower abdominal pain beginning and left lower
quadrant now month right lower quadrant for past 2-3 days.

EXAM:
CT ABDOMEN AND PELVIS WITH CONTRAST
TECHNIQUE: Multidetector CT imaging of the abdomen and pelvis was performed
using the standard protocol following bolus administration of
intravenous contrast.
CONTRAST:  100mL OMNIPAQUE IOHEXOL 300 MG/ML  SOLN

[Series 2: routine abd pel with · axial · 0.74mm/px · z∈[-1130,-710]mm · 14 of 94 slices shown, 19 images]
[im 5/94  soft-tissue]
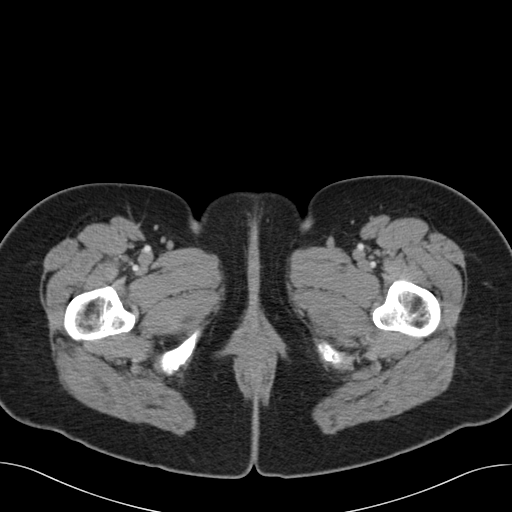
[im 5/94  bone]
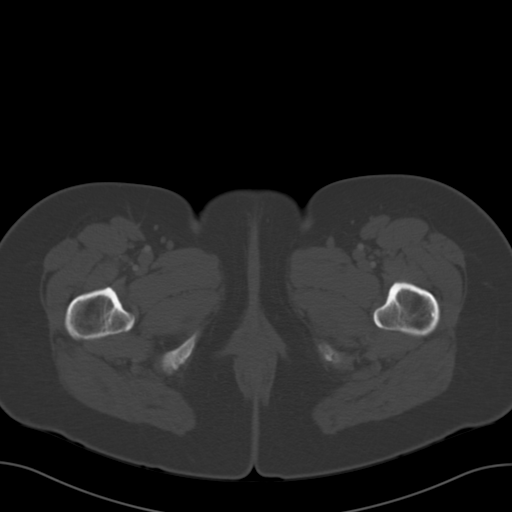
[im 14/94  soft-tissue]
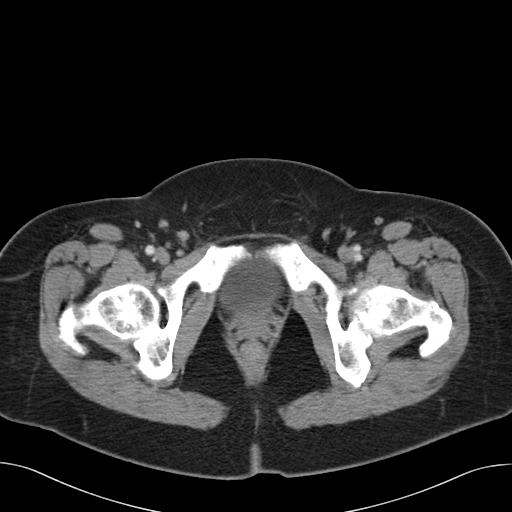
[im 19/94  soft-tissue]
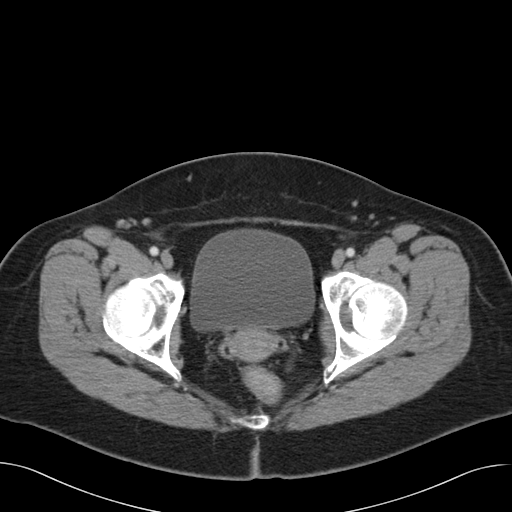
[im 28/94  soft-tissue]
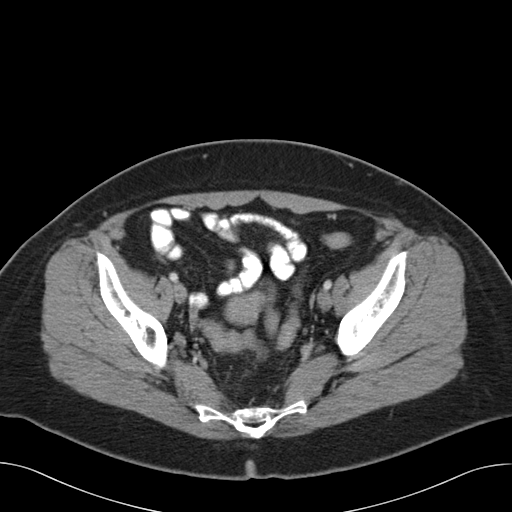
[im 33/94  soft-tissue]
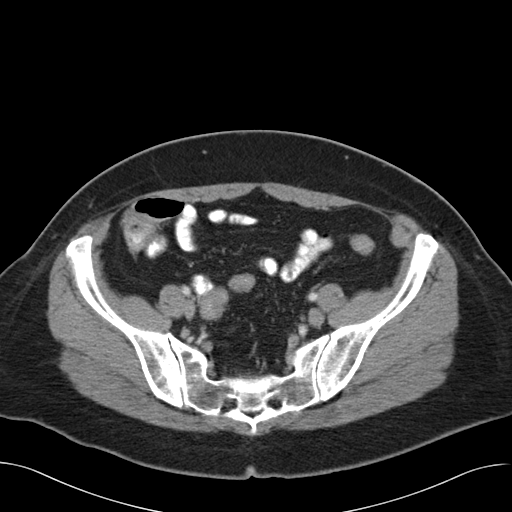
[im 42/94  soft-tissue]
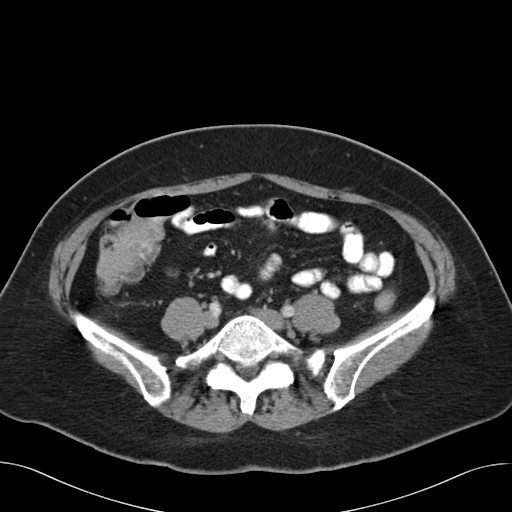
[im 47/94  soft-tissue]
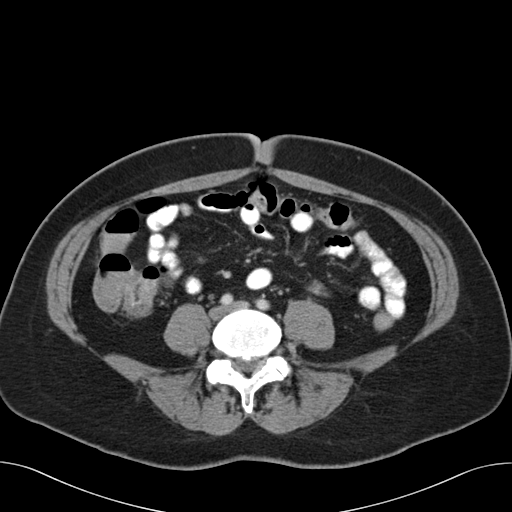
[im 52/94  soft-tissue]
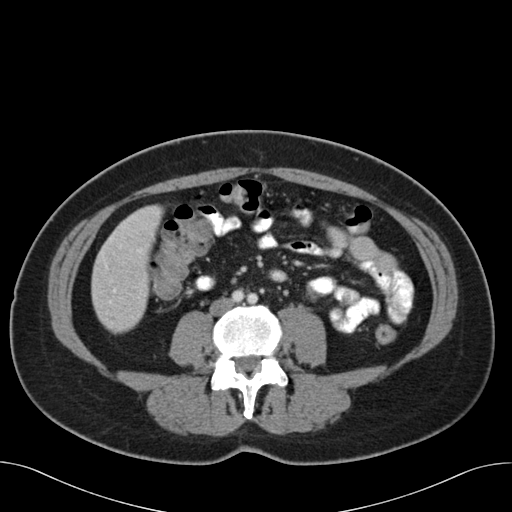
[im 61/94  soft-tissue]
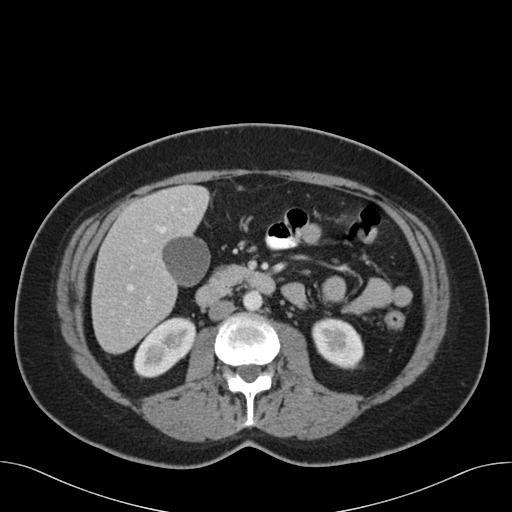
[im 61/94  bone]
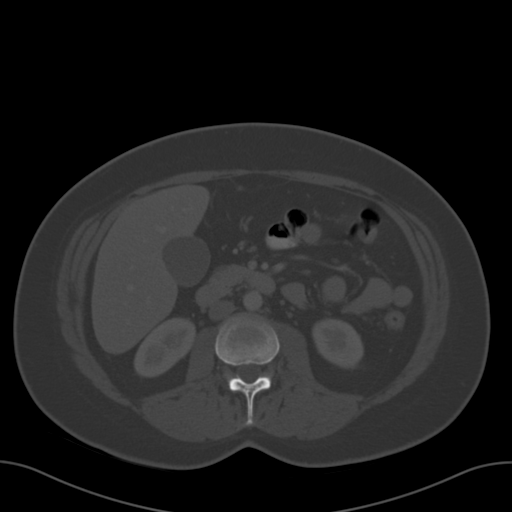
[im 66/94  soft-tissue]
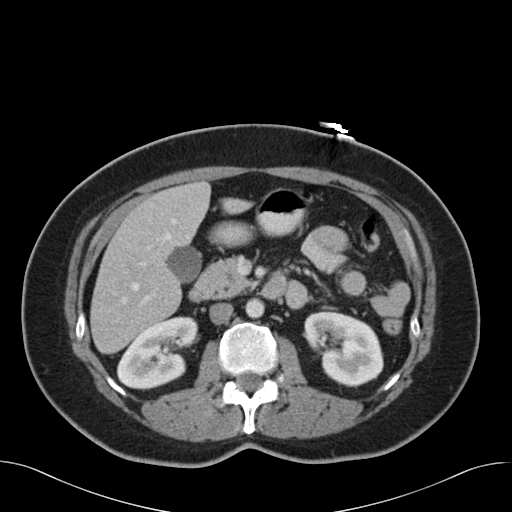
[im 75/94  soft-tissue]
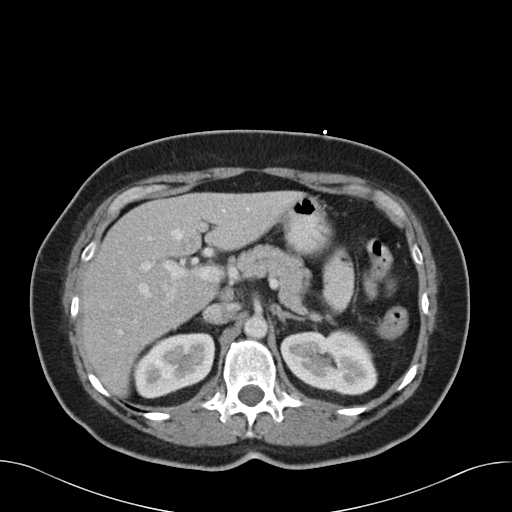
[im 75/94  lung]
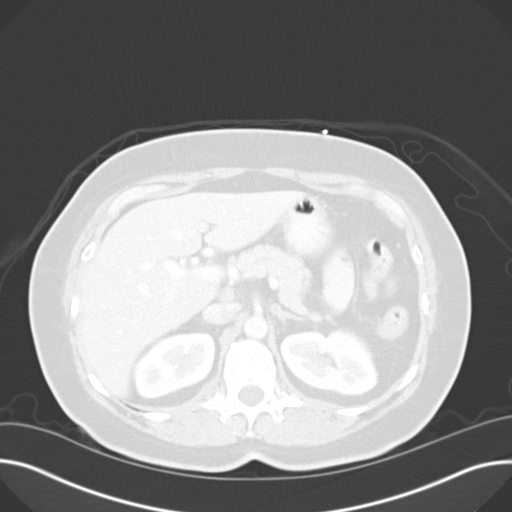
[im 80/94  soft-tissue]
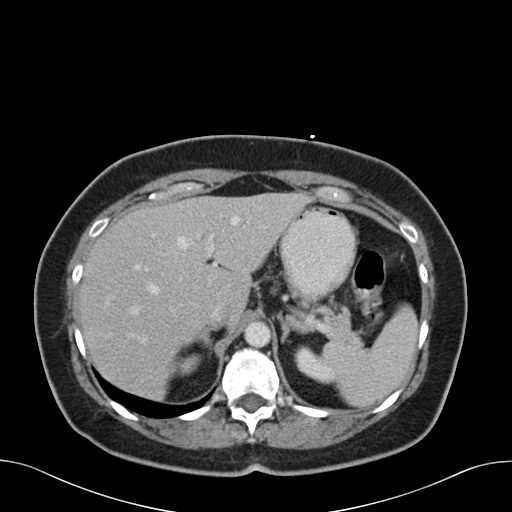
[im 80/94  lung]
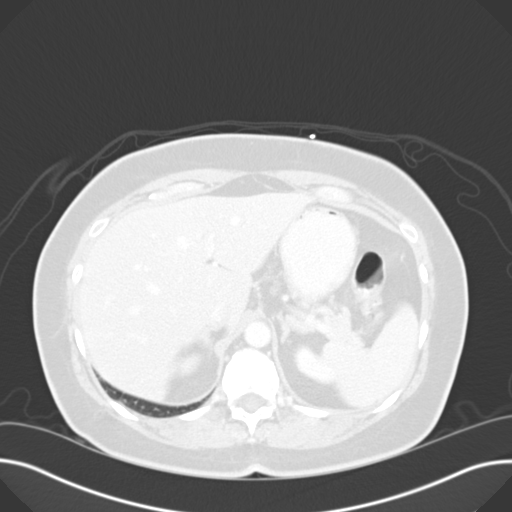
[im 84/94  lung]
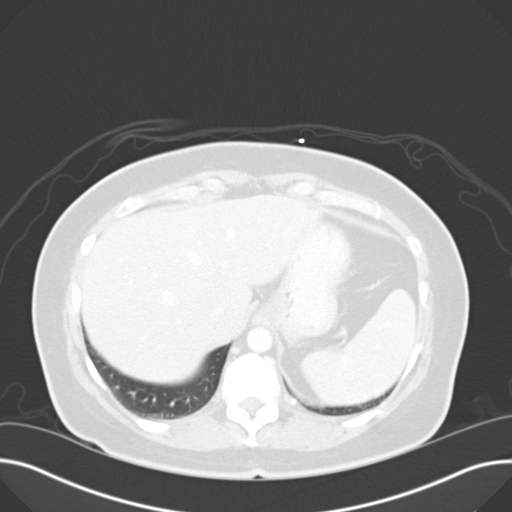
[im 89/94  soft-tissue]
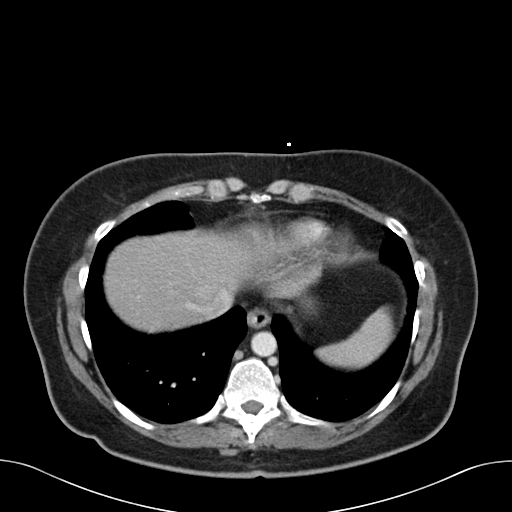
[im 89/94  lung]
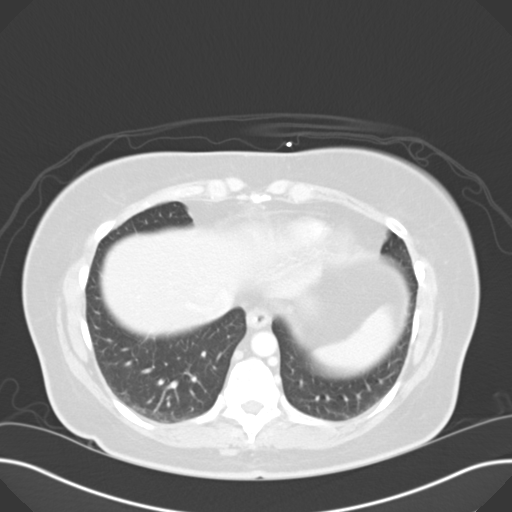

[14 of 32 positions shown; findings below may reference images not displayed]

FINDINGS: Lower chest: Lung bases are clear. No effusions. Heart is normal
size.

Hepatobiliary: Normal

Pancreas: Normal

Spleen: Normal

Adrenals/Urinary Tract: Normal

Stomach/Bowel: Appendix is normal. Stomach, large and small bowel
grossly unremarkable.

Vascular/Lymphatic: No evidence of aneurysm or adenopathy.

Reproductive: Uterus and adnexa unremarkable.  No mass.

Other: No free fluid or free air

Musculoskeletal: No acute bony abnormality or focal bone lesion.
IMPRESSION: No acute finding or significant abnormality.

## 2017-05-01 DIAGNOSIS — G518 Other disorders of facial nerve: Secondary | ICD-10-CM | POA: Diagnosis not present

## 2017-05-01 DIAGNOSIS — G43719 Chronic migraine without aura, intractable, without status migrainosus: Secondary | ICD-10-CM | POA: Diagnosis not present

## 2017-05-01 DIAGNOSIS — M542 Cervicalgia: Secondary | ICD-10-CM | POA: Diagnosis not present

## 2017-05-01 DIAGNOSIS — M791 Myalgia, unspecified site: Secondary | ICD-10-CM | POA: Diagnosis not present

## 2017-05-08 DIAGNOSIS — J309 Allergic rhinitis, unspecified: Secondary | ICD-10-CM | POA: Diagnosis not present

## 2017-05-30 DIAGNOSIS — J309 Allergic rhinitis, unspecified: Secondary | ICD-10-CM | POA: Diagnosis not present

## 2017-06-08 DIAGNOSIS — R112 Nausea with vomiting, unspecified: Secondary | ICD-10-CM | POA: Diagnosis not present

## 2017-06-08 DIAGNOSIS — G43801 Other migraine, not intractable, with status migrainosus: Secondary | ICD-10-CM | POA: Diagnosis not present

## 2017-06-08 DIAGNOSIS — F419 Anxiety disorder, unspecified: Secondary | ICD-10-CM | POA: Diagnosis not present

## 2017-06-08 DIAGNOSIS — Z86010 Personal history of colonic polyps: Secondary | ICD-10-CM | POA: Diagnosis not present

## 2017-06-08 DIAGNOSIS — Z833 Family history of diabetes mellitus: Secondary | ICD-10-CM | POA: Diagnosis not present

## 2017-06-08 DIAGNOSIS — Z79899 Other long term (current) drug therapy: Secondary | ICD-10-CM | POA: Diagnosis not present

## 2017-06-08 DIAGNOSIS — F0390 Unspecified dementia without behavioral disturbance: Secondary | ICD-10-CM | POA: Diagnosis not present

## 2017-06-08 DIAGNOSIS — Z952 Presence of prosthetic heart valve: Secondary | ICD-10-CM | POA: Diagnosis not present

## 2017-06-08 DIAGNOSIS — E119 Type 2 diabetes mellitus without complications: Secondary | ICD-10-CM | POA: Diagnosis not present

## 2017-06-08 DIAGNOSIS — Z7951 Long term (current) use of inhaled steroids: Secondary | ICD-10-CM | POA: Diagnosis not present

## 2017-06-08 DIAGNOSIS — Z6829 Body mass index (BMI) 29.0-29.9, adult: Secondary | ICD-10-CM | POA: Diagnosis not present

## 2017-06-08 DIAGNOSIS — Z8249 Family history of ischemic heart disease and other diseases of the circulatory system: Secondary | ICD-10-CM | POA: Diagnosis not present

## 2017-06-08 DIAGNOSIS — Z8673 Personal history of transient ischemic attack (TIA), and cerebral infarction without residual deficits: Secondary | ICD-10-CM | POA: Diagnosis not present

## 2017-06-08 DIAGNOSIS — Z794 Long term (current) use of insulin: Secondary | ICD-10-CM | POA: Diagnosis not present

## 2017-06-08 DIAGNOSIS — F329 Major depressive disorder, single episode, unspecified: Secondary | ICD-10-CM | POA: Diagnosis not present

## 2017-06-08 DIAGNOSIS — G629 Polyneuropathy, unspecified: Secondary | ICD-10-CM | POA: Diagnosis not present

## 2017-06-08 DIAGNOSIS — K589 Irritable bowel syndrome without diarrhea: Secondary | ICD-10-CM | POA: Diagnosis not present

## 2017-06-20 DIAGNOSIS — J309 Allergic rhinitis, unspecified: Secondary | ICD-10-CM | POA: Diagnosis not present

## 2017-06-27 DIAGNOSIS — M791 Myalgia, unspecified site: Secondary | ICD-10-CM | POA: Diagnosis not present

## 2017-06-27 DIAGNOSIS — M542 Cervicalgia: Secondary | ICD-10-CM | POA: Diagnosis not present

## 2017-06-27 DIAGNOSIS — G518 Other disorders of facial nerve: Secondary | ICD-10-CM | POA: Diagnosis not present

## 2017-06-27 DIAGNOSIS — G43719 Chronic migraine without aura, intractable, without status migrainosus: Secondary | ICD-10-CM | POA: Diagnosis not present

## 2017-07-20 DIAGNOSIS — Z1231 Encounter for screening mammogram for malignant neoplasm of breast: Secondary | ICD-10-CM | POA: Diagnosis not present

## 2017-07-25 DIAGNOSIS — J309 Allergic rhinitis, unspecified: Secondary | ICD-10-CM | POA: Diagnosis not present

## 2017-08-15 DIAGNOSIS — J309 Allergic rhinitis, unspecified: Secondary | ICD-10-CM | POA: Diagnosis not present

## 2017-08-17 ENCOUNTER — Other Ambulatory Visit: Payer: Self-pay | Admitting: Women's Health

## 2017-08-20 DIAGNOSIS — M791 Myalgia, unspecified site: Secondary | ICD-10-CM | POA: Diagnosis not present

## 2017-08-20 DIAGNOSIS — G518 Other disorders of facial nerve: Secondary | ICD-10-CM | POA: Diagnosis not present

## 2017-08-20 DIAGNOSIS — M542 Cervicalgia: Secondary | ICD-10-CM | POA: Diagnosis not present

## 2017-08-20 DIAGNOSIS — G43719 Chronic migraine without aura, intractable, without status migrainosus: Secondary | ICD-10-CM | POA: Diagnosis not present

## 2017-08-20 DIAGNOSIS — G509 Disorder of trigeminal nerve, unspecified: Secondary | ICD-10-CM | POA: Diagnosis not present

## 2017-08-20 DIAGNOSIS — G500 Trigeminal neuralgia: Secondary | ICD-10-CM | POA: Diagnosis not present

## 2017-09-04 DIAGNOSIS — H5213 Myopia, bilateral: Secondary | ICD-10-CM | POA: Diagnosis not present

## 2017-09-06 ENCOUNTER — Encounter: Payer: Self-pay | Admitting: Women's Health

## 2017-09-06 ENCOUNTER — Other Ambulatory Visit: Payer: Self-pay | Admitting: Women's Health

## 2017-09-06 NOTE — Telephone Encounter (Signed)
CE is scheduled 11/06/17 with [place].

## 2017-09-07 MED ORDER — ALPRAZOLAM 0.25 MG PO TABS: 0.2500 mg | ORAL_TABLET | Freq: Every evening | ORAL | 1 refills | Status: DC | PRN

## 2017-09-07 NOTE — Telephone Encounter (Signed)
Ok for refill? 

## 2017-09-07 NOTE — Telephone Encounter (Signed)
Called into pharmacy

## 2017-09-11 DIAGNOSIS — H66003 Acute suppurative otitis media without spontaneous rupture of ear drum, bilateral: Secondary | ICD-10-CM | POA: Diagnosis not present

## 2017-09-13 DIAGNOSIS — J309 Allergic rhinitis, unspecified: Secondary | ICD-10-CM | POA: Diagnosis not present

## 2017-10-02 DIAGNOSIS — G518 Other disorders of facial nerve: Secondary | ICD-10-CM | POA: Diagnosis not present

## 2017-10-02 DIAGNOSIS — M791 Myalgia, unspecified site: Secondary | ICD-10-CM | POA: Diagnosis not present

## 2017-10-02 DIAGNOSIS — J309 Allergic rhinitis, unspecified: Secondary | ICD-10-CM | POA: Diagnosis not present

## 2017-10-02 DIAGNOSIS — G43719 Chronic migraine without aura, intractable, without status migrainosus: Secondary | ICD-10-CM | POA: Diagnosis not present

## 2017-10-02 DIAGNOSIS — M542 Cervicalgia: Secondary | ICD-10-CM | POA: Diagnosis not present

## 2017-10-16 DIAGNOSIS — E119 Type 2 diabetes mellitus without complications: Secondary | ICD-10-CM | POA: Diagnosis not present

## 2017-10-16 DIAGNOSIS — Z794 Long term (current) use of insulin: Secondary | ICD-10-CM | POA: Diagnosis not present

## 2017-10-16 DIAGNOSIS — Z6828 Body mass index (BMI) 28.0-28.9, adult: Secondary | ICD-10-CM | POA: Diagnosis not present

## 2017-10-31 DIAGNOSIS — J309 Allergic rhinitis, unspecified: Secondary | ICD-10-CM | POA: Diagnosis not present

## 2017-11-06 ENCOUNTER — Ambulatory Visit: Payer: BLUE CROSS/BLUE SHIELD | Admitting: Women's Health

## 2017-11-06 ENCOUNTER — Encounter: Payer: Self-pay | Admitting: Women's Health

## 2017-11-06 VITALS — BP 132/74 | Ht 66.3 in | Wt 173.2 lb

## 2017-11-06 DIAGNOSIS — Z01419 Encounter for gynecological examination (general) (routine) without abnormal findings: Secondary | ICD-10-CM | POA: Diagnosis not present

## 2017-11-06 DIAGNOSIS — B373 Candidiasis of vulva and vagina: Secondary | ICD-10-CM

## 2017-11-06 DIAGNOSIS — Z79890 Hormone replacement therapy (postmenopausal): Secondary | ICD-10-CM

## 2017-11-06 DIAGNOSIS — F329 Major depressive disorder, single episode, unspecified: Secondary | ICD-10-CM

## 2017-11-06 DIAGNOSIS — F32A Depression, unspecified: Secondary | ICD-10-CM

## 2017-11-06 DIAGNOSIS — F419 Anxiety disorder, unspecified: Secondary | ICD-10-CM

## 2017-11-06 DIAGNOSIS — B3731 Acute candidiasis of vulva and vagina: Secondary | ICD-10-CM

## 2017-11-06 LAB — WET PREP FOR TRICH, YEAST, CLUE

## 2017-11-06 MED ORDER — ALPRAZOLAM 0.25 MG PO TABS: 0.2500 mg | ORAL_TABLET | Freq: Every evening | ORAL | 1 refills | Status: DC | PRN

## 2017-11-06 MED ORDER — NORETHINDRONE-ETH ESTRADIOL 1-5 MG-MCG PO TABS: 1.0000 | ORAL_TABLET | Freq: Every day | ORAL | 4 refills | Status: DC

## 2017-11-06 MED ORDER — SERTRALINE HCL 50 MG PO TABS: 50.0000 mg | ORAL_TABLET | Freq: Every day | ORAL | 4 refills | Status: DC

## 2017-11-06 MED ORDER — FLUCONAZOLE 100 MG PO TABS: ORAL_TABLET | ORAL | 0 refills | Status: DC

## 2017-11-06 NOTE — Progress Notes (Signed)
Cordie GriceMelissa Rookstool 11/12/1960 [id number]    History:    Presents for annual exam.  Postmenopausal on HRT with no bleeding.  Rare migraines, history of cyclic menstrual migraines.  Normal Pap and mammogram history.  Anxiety stable on Zoloft.  Occasional Xanax.  Diabetes on insulin Jardiance, doing better.  2013 benign breast biopsy.  2016 benign colon polyps.  Past medical history, past surgical history, family history and social history were all reviewed and documented in the EPIC chart.  Works for the school system.  Stepdaughter getting married next year.  ROS:  A ROS was performed and pertinent positives and negatives are included.  Exam:  Vitals:   11/06/17 0935  BP: 132/74  Weight: 173 lb 3.2 oz (78.6 kg)  Height: 5' 6.3" (1.684 m)   Body mass index is 27.7 kg/m.   General appearance:  Normal Thyroid:  Symmetrical, normal in size, without palpable masses or nodularity. Respiratory  Auscultation:  Clear without wheezing or rhonchi Cardiovascular  Auscultation:  Regular rate, without rubs, murmurs or gallops  Edema/varicosities:  Not grossly evident Abdominal  Soft,nontender, without masses, guarding or rebound.  Liver/spleen:  No organomegaly noted  Hernia:  None appreciated  Skin  Inspection:  Grossly normal   Breasts: Examined lying and sitting.     Right: Without masses, retractions, discharge or axillary adenopathy.     Left: Without masses, retractions, discharge or axillary adenopathy. Gentitourinary   Inguinal/mons:  Normal without inguinal adenopathy  External genitalia:  Normal  BUS/Urethra/Skene's glands:  Normal  Vagina:  Normal  Cervix:  Normal  Uterus:   normal in size, shape and contour.  Midline and mobile  Adnexa/parametria:     Rt: Without masses or tenderness.   Lt: Without masses or tenderness.  Anus and perineum: Normal  Digital rectal exam: Normal sphincter tone without palpated masses or tenderness  Assessment/Plan:  57 y.o. MWF G0 for annual  exam with complaint of vaginal irritation and itching.  Postmenopausal on HRT with no bleeding Yeast vaginitis  Diabetes, migraines, allergies- primary care manages labs and meds Anxiety/depression stable on Zoloft  Plan: Diflucan 100 mg 2 tablets today repeat in 3 days if needed, has had increased yeast infections since starting Jardiance.  SBE's, continue annual screening mammogram, calcium rich foods, vitamin D 2000 daily encouraged.  DEXA states will get at primary care.  Reviewed importance of weightbearing, balance type exercise.  Zoloft 50 mg p.o. daily prescription, proper use given has had counseling and has as needed.  Next 0.25 prescription, proper use given reviewed addictive properties and to use sparingly states she uses one prescription annually.  Pap normal 2018, new screening guidelines reviewed.     Harrington Challengerancy J Rashon Westrup K[facility] CenterWHNP, 1:56 PM 11/06/2017 1:56 PM

## 2017-11-06 NOTE — Patient Instructions (Addendum)
Health Maintenance for Postmenopausal Women Menopause is a normal process in which your reproductive ability comes to an end. This process happens gradually over a span of months to years, usually between the ages of 53 and 55. Menopause is complete when you have missed 12 consecutive menstrual periods. It is important to talk with your health care provider about some of the most common conditions that affect postmenopausal women, such as heart disease, cancer, and bone loss (osteoporosis). Adopting a healthy lifestyle and getting preventive care can help to promote your health and wellness. Those actions can also lower your chances of developing some of these common conditions. What should I know about menopause? During menopause, you may experience a number of symptoms, such as:  Moderate-to-severe hot flashes.  Night sweats.  Decrease in sex drive.  Mood swings.  Headaches.  Tiredness.  Irritability.  Memory problems.  Insomnia.  Choosing to treat or not to treat menopausal changes is an individual decision that you make with your health care provider. What should I know about hormone replacement therapy and supplements? Hormone therapy products are effective for treating symptoms that are associated with menopause, such as hot flashes and night sweats. Hormone replacement carries certain risks, especially as you become older. If you are thinking about using estrogen or estrogen with progestin treatments, discuss the benefits and risks with your health care provider. What should I know about heart disease and stroke? Heart disease, heart attack, and stroke become more likely as you age. This may be due, in part, to the hormonal changes that your body experiences during menopause. These can affect how your body processes dietary fats, triglycerides, and cholesterol. Heart attack and stroke are both medical emergencies. There are many things that you can do to help prevent heart disease  and stroke:  Have your blood pressure checked at least every 1-2 years. High blood pressure causes heart disease and increases the risk of stroke.  If you are 55-65 years old, ask your health care provider if you should take aspirin to prevent a heart attack or a stroke.  Do not use any tobacco products, including cigarettes, chewing tobacco, or electronic cigarettes. If you need help quitting, ask your health care provider.  It is important to eat a healthy diet and maintain a healthy weight. ? Be sure to include plenty of vegetables, fruits, low-fat dairy products, and lean protein. ? Avoid eating foods that are high in solid fats, added sugars, or salt (sodium).  Get regular exercise. This is one of the most important things that you can do for your health. ? Try to exercise for at least 150 minutes each week. The type of exercise that you do should increase your heart rate and make you sweat. This is known as moderate-intensity exercise. ? Try to do strengthening exercises at least twice each week. Do these in addition to the moderate-intensity exercise.  Know your numbers.Ask your health care provider to check your cholesterol and your blood glucose. Continue to have your blood tested as directed by your health care provider.  What should I know about cancer screening? There are several types of cancer. Take the following steps to reduce your risk and to catch any cancer development as early as possible. Breast Cancer  Practice breast self-awareness. ? This means understanding how your breasts normally appear and feel. ? It also means doing regular breast self-exams. Let your health care provider know about any changes, no matter how small.  If you are 55  or older, have a clinician do a breast exam (clinical breast exam or CBE) every year. Depending on your age, family history, and medical history, it may be recommended that you also have a yearly breast X-ray (mammogram).  If you  have a family history of breast cancer, talk with your health care provider about genetic screening.  If you are at high risk for breast cancer, talk with your health care provider about having an MRI and a mammogram every year.  Breast cancer (BRCA) gene test is recommended for women who have family members with BRCA-related cancers. Results of the assessment will determine the need for genetic counseling and BRCA1 and for BRCA2 testing. BRCA-related cancers include these types: ? Breast. This occurs in males or females. ? Ovarian. ? Tubal. This may also be called fallopian tube cancer. ? Cancer of the abdominal or pelvic lining (peritoneal cancer). ? Prostate. ? Pancreatic.  Cervical, Uterine, and Ovarian Cancer Your health care provider may recommend that you be screened regularly for cancer of the pelvic organs. These include your ovaries, uterus, and vagina. This screening involves a pelvic exam, which includes checking for microscopic changes to the surface of your cervix (Pap test).  For women ages 55-65, health care providers may recommend a pelvic exam and a Pap test every three years. For women ages 55-65, they may recommend the Pap test and pelvic exam, combined with testing for human papilloma virus (HPV), every five years. Some types of HPV increase your risk of cervical cancer. Testing for HPV may also be done on women of any age who have unclear Pap test results.  Other health care providers may not recommend any screening for nonpregnant women who are considered low risk for pelvic cancer and have no symptoms. Ask your health care provider if a screening pelvic exam is right for you.  If you have had past treatment for cervical cancer or a condition that could lead to cancer, you need Pap tests and screening for cancer for at least 20 years after your treatment. If Pap tests have been discontinued for you, your risk factors (such as having a new sexual partner) need to be  reassessed to determine if you should start having screenings again. Some women have medical problems that increase the chance of getting cervical cancer. In these cases, your health care provider may recommend that you have screening and Pap tests more often.  If you have a family history of uterine cancer or ovarian cancer, talk with your health care provider about genetic screening.  If you have vaginal bleeding after reaching menopause, tell your health care provider.  There are currently no reliable tests available to screen for ovarian cancer.  Lung Cancer Lung cancer screening is recommended for adults 69-62 years old who are at high risk for lung cancer because of a history of smoking. A yearly low-dose CT scan of the lungs is recommended if you:  Currently smoke.  Have a history of at least 30 pack-years of smoking and you currently smoke or have quit within the past 15 years. A pack-year is smoking an average of one pack of cigarettes per day for one year.  Yearly screening should:  Continue until it has been 15 years since you quit.  Stop if you develop a health problem that would prevent you from having lung cancer treatment.  Colorectal Cancer  This type of cancer can be detected and can often be prevented.  Routine colorectal cancer screening usually begins at  age 42 and continues through age 45.  If you have risk factors for colon cancer, your health care provider may recommend that you be screened at an earlier age.  If you have a family history of colorectal cancer, talk with your health care provider about genetic screening.  Your health care provider may also recommend using home test kits to check for hidden blood in your stool.  A small camera at the end of a tube can be used to examine your colon directly (sigmoidoscopy or colonoscopy). This is done to check for the earliest forms of colorectal cancer.  Direct examination of the colon should be repeated every  5-10 years until age 71. However, if early forms of precancerous polyps or small growths are found or if you have a family history or genetic risk for colorectal cancer, you may need to be screened more often.  Skin Cancer  Check your skin from head to toe regularly.  Monitor any moles. Be sure to tell your health care provider: ? About any new moles or changes in moles, especially if there is a change in a mole's shape or color. ? If you have a mole that is larger than the size of a pencil eraser.  If any of your family members has a history of skin cancer, especially at a  age, talk with your health care provider about genetic screening.  Always use sunscreen. Apply sunscreen liberally and repeatedly throughout the day.  Whenever you are outside, protect yourself by wearing long sleeves, pants, a wide-brimmed hat, and sunglasses.  What should I know about osteoporosis? Osteoporosis is a condition in which bone destruction happens more quickly than new bone creation. After menopause, you may be at an increased risk for osteoporosis. To help prevent osteoporosis or the bone fractures that can happen because of osteoporosis, the following is recommended:  If you are 46-71 years old, get at least 1,000 mg of calcium and at least 600 mg of vitamin D per day.  If you are older than age 55 but er than age 65, get at least 1,200 mg of calcium and at least 600 mg of vitamin D per day.  If you are older than age 54, get at least 1,200 mg of calcium and at least 800 mg of vitamin D per day.  Smoking and excessive alcohol intake increase the risk of osteoporosis. Eat foods that are rich in calcium and vitamin D, and do weight-bearing exercises several times each week as directed by your health care provider. What should I know about how menopause affects my mental health? Depression may occur at any age, but it is more common as you become older. Common symptoms of depression  include:  Low or sad mood.  Changes in sleep patterns.  Changes in appetite or eating patterns.  Feeling an overall lack of motivation or enjoyment of activities that you previously enjoyed.  Frequent crying spells.  Talk with your health care provider if you think that you are experiencing depression. What should I know about immunizations? It is important that you get and maintain your immunizations. These include:  Tetanus, diphtheria, and pertussis (Tdap) booster vaccine.  Influenza every year before the flu season begins.  Pneumonia vaccine.  Shingles vaccine.  Your health care provider may also recommend other immunizations. This information is not intended to replace advice given to you by your health care provider. Make sure you discuss any questions you have with your health care provider. Document Released: 05/19/2005  Document Revised: 10/15/2015 Document Reviewed: 12/29/2014 Elsevier Interactive Patient Education  2018 [employer].  Vaginal Yeast infection, Adult Vaginal yeast infection is a condition that causes soreness, swelling, and redness (inflammation) of the vagina. It also causes vaginal discharge. This is a common condition. Some women get this infection frequently. What are the causes? This condition is caused by a change in the normal balance of the yeast (candida) and bacteria that live in the vagina. This change causes an overgrowth of yeast, which causes the inflammation. What increases the risk? This condition is more likely to develop in:  Women who take antibiotic medicines.  Women who have diabetes.  Women who take birth control pills.  Women who are pregnant.  Women who douche often.  Women who have a weak defense (immune) system.  Women who have been taking steroid medicines for a long time.  Women who frequently wear tight clothing.  What are the signs or symptoms? Symptoms of this condition include:  White, thick vaginal  discharge.  Swelling, itching, redness, and irritation of the vagina. The lips of the vagina (vulva) may be affected as well.  Pain or a burning feeling while urinating.  Pain during sex.  How is this diagnosed? This condition is diagnosed with a medical history and physical exam. This will include a pelvic exam. Your health care provider will examine a sample of your vaginal discharge under a microscope. Your health care provider may send this sample for testing to confirm the diagnosis. How is this treated? This condition is treated with medicine. Medicines may be over-the-counter or prescription. You may be told to use one or more of the following:  Medicine that is taken orally.  Medicine that is applied as a cream.  Medicine that is inserted directly into the vagina (suppository).  Follow these instructions at home:  Take or apply over-the-counter and prescription medicines only as told by your health care provider.  Do not have sex until your health care provider has approved. Tell your sex partner that you have a yeast infection. That person should go to his or her health care provider if he or she develops symptoms.  Do not wear tight clothes, such as pantyhose or tight pants.  Avoid using tampons until your health care provider approves.  Eat more yogurt. This may help to keep your yeast infection from returning.  Try taking a sitz bath to help with discomfort. This is a warm water bath that is taken while you are sitting down. The water should only come up to your hips and should cover your buttocks. Do this 3-4 times per day or as told by your health care provider.  Do not douche.  Wear breathable, cotton underwear.  If you have diabetes, keep your blood sugar levels under control. Contact a health care provider if:  You have a fever.  Your symptoms go away and then return.  Your symptoms do not get better with treatment.  Your symptoms get worse.  You have  new symptoms.  You develop blisters in or around your vagina.  You have blood coming from your vagina and it is not your menstrual period.  You develop pain in your abdomen. This information is not intended to replace advice given to you by your health care provider. Make sure you discuss any questions you have with your health care provider. Document Released: 01/04/2005 Document Revised: 09/08/2015 Document Reviewed: 09/28/2014 Elsevier Interactive Patient Education  2018 [employer].

## 2017-11-14 DIAGNOSIS — M542 Cervicalgia: Secondary | ICD-10-CM | POA: Diagnosis not present

## 2017-11-14 DIAGNOSIS — M791 Myalgia, unspecified site: Secondary | ICD-10-CM | POA: Diagnosis not present

## 2017-11-14 DIAGNOSIS — G43719 Chronic migraine without aura, intractable, without status migrainosus: Secondary | ICD-10-CM | POA: Diagnosis not present

## 2017-11-14 DIAGNOSIS — G518 Other disorders of facial nerve: Secondary | ICD-10-CM | POA: Diagnosis not present

## 2017-11-21 DIAGNOSIS — J309 Allergic rhinitis, unspecified: Secondary | ICD-10-CM | POA: Diagnosis not present

## 2017-12-17 DIAGNOSIS — J309 Allergic rhinitis, unspecified: Secondary | ICD-10-CM | POA: Diagnosis not present

## 2017-12-19 DIAGNOSIS — J309 Allergic rhinitis, unspecified: Secondary | ICD-10-CM | POA: Diagnosis not present

## 2018-01-02 DIAGNOSIS — M791 Myalgia, unspecified site: Secondary | ICD-10-CM | POA: Diagnosis not present

## 2018-01-02 DIAGNOSIS — M542 Cervicalgia: Secondary | ICD-10-CM | POA: Diagnosis not present

## 2018-01-02 DIAGNOSIS — G518 Other disorders of facial nerve: Secondary | ICD-10-CM | POA: Diagnosis not present

## 2018-01-02 DIAGNOSIS — G43719 Chronic migraine without aura, intractable, without status migrainosus: Secondary | ICD-10-CM | POA: Diagnosis not present

## 2018-01-07 DIAGNOSIS — G510 Bell's palsy: Secondary | ICD-10-CM | POA: Diagnosis not present

## 2018-01-07 DIAGNOSIS — R202 Paresthesia of skin: Secondary | ICD-10-CM | POA: Diagnosis not present

## 2018-01-07 DIAGNOSIS — Z7951 Long term (current) use of inhaled steroids: Secondary | ICD-10-CM | POA: Diagnosis not present

## 2018-01-07 DIAGNOSIS — M542 Cervicalgia: Secondary | ICD-10-CM | POA: Diagnosis not present

## 2018-01-07 DIAGNOSIS — Z882 Allergy status to sulfonamides status: Secondary | ICD-10-CM | POA: Diagnosis not present

## 2018-01-07 DIAGNOSIS — E119 Type 2 diabetes mellitus without complications: Secondary | ICD-10-CM | POA: Diagnosis not present

## 2018-01-07 DIAGNOSIS — M549 Dorsalgia, unspecified: Secondary | ICD-10-CM | POA: Diagnosis not present

## 2018-01-07 DIAGNOSIS — Z79899 Other long term (current) drug therapy: Secondary | ICD-10-CM | POA: Diagnosis not present

## 2018-01-07 DIAGNOSIS — Z794 Long term (current) use of insulin: Secondary | ICD-10-CM | POA: Diagnosis not present

## 2018-01-24 ENCOUNTER — Other Ambulatory Visit: Payer: Self-pay | Admitting: Women's Health

## 2018-01-26 NOTE — Telephone Encounter (Signed)
Ok for both

## 2018-01-28 NOTE — Telephone Encounter (Signed)
ALprazolam refill called in to pharmacy.

## 2018-01-31 DIAGNOSIS — J0111 Acute recurrent frontal sinusitis: Secondary | ICD-10-CM | POA: Diagnosis not present

## 2018-02-19 DIAGNOSIS — M791 Myalgia, unspecified site: Secondary | ICD-10-CM | POA: Diagnosis not present

## 2018-02-19 DIAGNOSIS — G43719 Chronic migraine without aura, intractable, without status migrainosus: Secondary | ICD-10-CM | POA: Diagnosis not present

## 2018-02-19 DIAGNOSIS — M542 Cervicalgia: Secondary | ICD-10-CM | POA: Diagnosis not present

## 2018-03-15 DIAGNOSIS — J0100 Acute maxillary sinusitis, unspecified: Secondary | ICD-10-CM | POA: Diagnosis not present

## 2018-03-15 DIAGNOSIS — R05 Cough: Secondary | ICD-10-CM | POA: Diagnosis not present

## 2018-03-20 DIAGNOSIS — J019 Acute sinusitis, unspecified: Secondary | ICD-10-CM | POA: Diagnosis not present

## 2018-03-20 DIAGNOSIS — Z6828 Body mass index (BMI) 28.0-28.9, adult: Secondary | ICD-10-CM | POA: Diagnosis not present

## 2018-04-01 DIAGNOSIS — M791 Myalgia, unspecified site: Secondary | ICD-10-CM | POA: Diagnosis not present

## 2018-04-01 DIAGNOSIS — M542 Cervicalgia: Secondary | ICD-10-CM | POA: Diagnosis not present

## 2018-04-01 DIAGNOSIS — G518 Other disorders of facial nerve: Secondary | ICD-10-CM | POA: Diagnosis not present

## 2018-04-01 DIAGNOSIS — G43719 Chronic migraine without aura, intractable, without status migrainosus: Secondary | ICD-10-CM | POA: Diagnosis not present

## 2018-04-08 ENCOUNTER — Other Ambulatory Visit: Payer: Self-pay | Admitting: Women's Health

## 2018-04-08 ENCOUNTER — Encounter: Payer: Self-pay | Admitting: Women's Health

## 2018-04-08 ENCOUNTER — Ambulatory Visit: Payer: BLUE CROSS/BLUE SHIELD | Admitting: Women's Health

## 2018-04-08 VITALS — BP 122/82

## 2018-04-08 DIAGNOSIS — R140 Abdominal distension (gaseous): Secondary | ICD-10-CM | POA: Diagnosis not present

## 2018-04-08 DIAGNOSIS — F32A Depression, unspecified: Secondary | ICD-10-CM

## 2018-04-08 DIAGNOSIS — F329 Major depressive disorder, single episode, unspecified: Secondary | ICD-10-CM

## 2018-04-08 DIAGNOSIS — R300 Dysuria: Secondary | ICD-10-CM | POA: Diagnosis not present

## 2018-04-08 DIAGNOSIS — R5383 Other fatigue: Secondary | ICD-10-CM | POA: Diagnosis not present

## 2018-04-08 DIAGNOSIS — N949 Unspecified condition associated with female genital organs and menstrual cycle: Secondary | ICD-10-CM

## 2018-04-08 MED ORDER — FLUCONAZOLE 150 MG PO TABS: 150.0000 mg | ORAL_TABLET | Freq: Once | ORAL | 0 refills | Status: AC

## 2018-04-08 NOTE — Patient Instructions (Signed)
Vaginal Yeast infection, Adult    Vaginal yeast infection is a condition that causes vaginal discharge as well as soreness, swelling, and redness (inflammation) of the vagina. This is a common condition. Some women get this infection frequently.  What are the causes?  This condition is caused by a change in the normal balance of the yeast (candida) and bacteria that live in the vagina. This change causes an overgrowth of yeast, which causes the inflammation.  What increases the risk?  The condition is more likely to develop in women who:   Take antibiotic medicines.   Have diabetes.   Take birth control pills.   Are pregnant.   Douche often.   Have a weak body defense system (immune system).   Have been taking steroid medicines for a long time.   Frequently wear tight clothing.  What are the signs or symptoms?  Symptoms of this condition include:   White, thick, creamy vaginal discharge.   Swelling, itching, redness, and irritation of the vagina. The lips of the vagina (vulva) may be affected as well.   Pain or a burning feeling while urinating.   Pain during sex.  How is this diagnosed?  This condition is diagnosed based on:   Your medical history.   A physical exam.   A pelvic exam. Your health care provider will examine a sample of your vaginal discharge under a microscope. Your health care provider may send this sample for testing to confirm the diagnosis.  How is this treated?  This condition is treated with medicine. Medicines may be over-the-counter or prescription. You may be told to use one or more of the following:   Medicine that is taken by mouth (orally).   Medicine that is applied as a cream (topically).   Medicine that is inserted directly into the vagina (suppository).  Follow these instructions at home:    Lifestyle   Do not have sex until your health care provider approves. Tell your sex partner that you have a yeast infection. That person should go to his or her health care  provider and ask if they should also be treated.   Do not wear tight clothes, such as pantyhose or tight pants.   Wear breathable cotton underwear.  General instructions   Take or apply over-the-counter and prescription medicines only as told by your health care provider.   Eat more yogurt. This may help to keep your yeast infection from returning.   Do not use tampons until your health care provider approves.   Try taking a sitz bath to help with discomfort. This is a warm water bath that is taken while you are sitting down. The water should only come up to your hips and should cover your buttocks. Do this 3-4 times per day or as told by your health care provider.   Do not douche.   If you have diabetes, keep your blood sugar levels under control.   Keep all follow-up visits as told by your health care provider. This is important.  Contact a health care provider if:   You have a fever.   Your symptoms go away and then return.   Your symptoms do not get better with treatment.   Your symptoms get worse.   You have new symptoms.   You develop blisters in or around your vagina.   You have blood coming from your vagina and it is not your menstrual period.   You develop pain in your abdomen.  Summary     Vaginal yeast infection is a condition that causes discharge as well as soreness, swelling, and redness (inflammation) of the vagina.   This condition is treated with medicine. Medicines may be over-the-counter or prescription.   Take or apply over-the-counter and prescription medicines only as told by your health care provider.   Do not douche. Do not have sex or use tampons until your health care provider approves.   Contact a health care provider if your symptoms do not get better with treatment or your symptoms go away and then return.  This information is not intended to replace advice given to you by your health care provider. Make sure you discuss any questions you have with your health care  provider.  Document Released: 01/04/2005 Document Revised: 08/13/2017 Document Reviewed: 08/13/2017  Elsevier Interactive Patient Education  2019 [employer]

## 2018-04-08 NOTE — Progress Notes (Signed)
57 y.o MWF G0,Presents with complaint of lower abdominal pressure/bloating, vaginal itching, burning when initiating urinating, fatigue and feeling down starting 3 days ago.  Denies pain at end of stream of urination.  Reports no nausea, vomiting, diarrhea, vaginal discharge, odor or fever.  Post menopausal on HRT with no bleeding with persistent hot flushes. History of migraines, anxiety, diabetes. Zoloft and occasional Xanax for anxiety, diabetes treated with insulin and Jardiance, well controlled.   Bell's palsy 12/2017, treated with steroids and Valtrax, resolved. Desk job.  Exam: Appears well.  Abdominal: Soft, non tender,without masses, guarding or rebound. GU: Negative CVAT, External genitalia no visible discharge, erythema, speculum exam , Mild vaginal atrophy, Negative erythema.  Wet prep positive yeast. Bimanual no CMT or adnexal tenderness minimal discomfort, pressure sensation. Urinalysis: negative leukocytes, negative nitrates, +3 glucose ,0-5 WBCs, no RBCs, 6-10 squamous epithelials, few bacteria  Yeast Vaginitis Fatigue/general malaise Abdominal bloating 12/2017 Bell's palsy  Plan: Diflucan 150 mg 1 tablet today repeat in 3 days if needed, proper use discussed.. CBC, TSH.  Schedule US  .  Urine culture pending. Discussed UTI prevention . Encouraged self care practices and daily activities to manage stress and depression. Encouraged to continue good self-care with diabetes.  Encouraged increased rest.  Reviewed no visible HSV lesions, Valtrex given with steroid treatment for Bell's palsy.

## 2018-04-09 LAB — CBC WITH DIFFERENTIAL/PLATELET
Absolute Monocytes: 634 cells/uL (ref 200–950)
Basophils Absolute: 104 cells/uL (ref 0–200)
Basophils Relative: 1 %
Eosinophils Absolute: 395 cells/uL (ref 15–500)
Eosinophils Relative: 3.8 %
HCT: 45 % (ref 35.0–45.0)
Hemoglobin: 15.1 g/dL (ref 11.7–15.5)
Lymphs Abs: 3505 cells/uL (ref 850–3900)
MCH: 29.6 pg (ref 27.0–33.0)
MCHC: 33.6 g/dL (ref 32.0–36.0)
MCV: 88.2 fL (ref 80.0–100.0)
MPV: 10.7 fL (ref 7.5–12.5)
Monocytes Relative: 6.1 %
Neutro Abs: 5762 cells/uL (ref 1500–7800)
Neutrophils Relative %: 55.4 %
Platelets: 315 10*3/uL (ref 140–400)
RBC: 5.1 10*6/uL (ref 3.80–5.10)
RDW: 11.3 % (ref 11.0–15.0)
TOTAL LYMPHOCYTE: 33.7 %
WBC: 10.4 10*3/uL (ref 3.8–10.8)

## 2018-04-09 LAB — TSH: TSH: 1.57 mIU/L

## 2018-04-10 LAB — URINE CULTURE
MICRO NUMBER: 91556688
Result:: NO GROWTH
SPECIMEN QUALITY:: ADEQUATE

## 2018-04-11 LAB — URINALYSIS, COMPLETE W/RFL CULTURE
Bilirubin Urine: NEGATIVE
Hgb urine dipstick: NEGATIVE
Hyaline Cast: NONE SEEN /LPF
KETONES UR: NEGATIVE
Leukocyte Esterase: NEGATIVE
Nitrites, Initial: NEGATIVE
Protein, ur: NEGATIVE
RBC / HPF: NONE SEEN /HPF (ref 0–2)
Specific Gravity, Urine: 1.02 (ref 1.001–1.03)
pH: 5.5 (ref 5.0–8.0)

## 2018-04-11 LAB — CULTURE INDICATED

## 2018-04-11 LAB — WET PREP FOR TRICH, YEAST, CLUE

## 2018-04-17 DIAGNOSIS — J309 Allergic rhinitis, unspecified: Secondary | ICD-10-CM | POA: Diagnosis not present

## 2018-04-24 ENCOUNTER — Other Ambulatory Visit: Payer: Self-pay | Admitting: Women's Health

## 2018-04-24 ENCOUNTER — Ambulatory Visit: Payer: BLUE CROSS/BLUE SHIELD | Admitting: Women's Health

## 2018-04-24 ENCOUNTER — Encounter: Payer: Self-pay | Admitting: Women's Health

## 2018-04-24 ENCOUNTER — Ambulatory Visit (INDEPENDENT_AMBULATORY_CARE_PROVIDER_SITE_OTHER): Payer: BLUE CROSS/BLUE SHIELD

## 2018-04-24 VITALS — BP 124/80

## 2018-04-24 DIAGNOSIS — F419 Anxiety disorder, unspecified: Secondary | ICD-10-CM | POA: Diagnosis not present

## 2018-04-24 DIAGNOSIS — N83201 Unspecified ovarian cyst, right side: Secondary | ICD-10-CM

## 2018-04-24 DIAGNOSIS — R140 Abdominal distension (gaseous): Secondary | ICD-10-CM

## 2018-04-24 DIAGNOSIS — R232 Flushing: Secondary | ICD-10-CM | POA: Diagnosis not present

## 2018-04-24 MED ORDER — VENLAFAXINE HCL ER 37.5 MG PO CP24: 37.5000 mg | ORAL_CAPSULE | Freq: Every day | ORAL | 4 refills | Status: DC

## 2018-04-24 MED ORDER — IBUPROFEN 600 MG PO TABS: 600.0000 mg | ORAL_TABLET | Freq: Three times a day (TID) | ORAL | 1 refills | Status: DC | PRN

## 2018-04-24 NOTE — Progress Notes (Signed)
58 year old MWF G0 presents for ultrasound, and was complaining of increased bloating, pelvic pressure.  Numerous complaints of increased moodiness, short tempered, hot flushes, pelvic bloating, fatigue, has missed work due to pelvic discomfort and generally not feeling well.    Diabetes on Jardiance and  insulin, good control, anxiety/depression on Zoloft 50 mg for greater than 10 years, FemHRT, with no bleeding, has no allergies.  Has had problems with chronic migraines which have decreased over the past few years does get trigger injections with neurologist.  Increased stress, mother is in a long-term care facility with dementia and tries to visit weekly hour and a half drive.  Work and home life good.  Exam: Appears well.  Ultrasound: T/V and T/A uterus anteverted homogeneous.  Endometrium within normal limits 1 mm.  Right ovary thick thin-walled echo-free avascular cyst 14 x 10 x 13 mm.  Left ovary normal.  Negative cul-de-sac.  Noted small amount of fluid filled cervix. No CVAT, abdomen soft without rebound, slight tenderness in lower abdomen, external genitalia limits, speculum exam no visible discharge, blood, Pap brush inserted into cervix, no fluid noted.  Small ovarian cyst Anxiety/depression/menopausal symptoms/general poor feeling  Plan: TSH, Ca 125.  Reviewed ovarian cyst small, most likely benign.  Options reviewed, will try Effexor 37.5, will decrease Zoloft to half tablet and gradually increase Effexor, reviewed 75 target dose to 150 mg may be needed.  Reviewed may help with hot flushes and general mood.  Reviewed importance of self-care, leisure activities.  [facility] will discuss at annual exam possibly decreasing dosage.  Reviewed if Ca125 low no further surveillance.  Motrin 600 mg every 6 hours as needed for pelvic cramping.  Counseling as needed.

## 2018-04-24 NOTE — Patient Instructions (Signed)
Ovarian Cyst         An ovarian cyst is a fluid-filled sac that forms on an ovary. The ovaries are small organs that produce eggs in women. Various types of cysts can form on the ovaries. Some may cause symptoms and require treatment. Most ovarian cysts go away on their own, are not cancerous (are benign), and do not cause problems.  Common types of ovarian cysts include:  · Functional (follicle) cysts.  ? Occur during the menstrual cycle, and usually go away with the next menstrual cycle if you do not get pregnant.  ? Usually cause no symptoms.  · Endometriomas.  ? Are cysts that form from the tissue that lines the uterus (endometrium).  ? Are sometimes called “chocolate cysts” because they become filled with blood that turns brown.  ? Can cause pain in the lower abdomen during intercourse and during your period.  · Cystadenoma cysts.  ? Develop from cells on the outside surface of the ovary.  ? Can get very large and cause lower abdomen pain and pain with intercourse.  ? Can cause severe pain if they twist or break open (rupture).  · Dermoid cysts.  ? Are sometimes found in both ovaries.  ? May contain different kinds of body tissue, such as skin, teeth, hair, or cartilage.  ? Usually do not cause symptoms unless they get very big.  · Theca lutein cysts.  ? Occur when too much of a certain hormone (human chorionic gonadotropin) is produced and overstimulates the ovaries to produce an egg.  ? Are most common after having procedures used to assist with the conception of a baby (in vitro fertilization).  What are the causes?  Ovarian cysts may be caused by:  · Ovarian hyperstimulation syndrome. This is a condition that can develop from taking fertility medicines. It causes multiple large ovarian cysts to form.  · Polycystic ovarian syndrome (PCOS). This is a common hormonal disorder that can cause ovarian cysts, as well as problems with your period or fertility.  What increases the risk?  The following factors may  make you more likely to develop ovarian cysts:  · Being overweight or obese.  · Taking fertility medicines.  · Taking certain forms of hormonal birth control.  · Smoking.  What are the signs or symptoms?  Many ovarian cysts do not cause symptoms. If symptoms are present, they may include:  · Pelvic pain or pressure.  · Pain in the lower abdomen.  · Pain during sex.  · Abdominal swelling.  · Abnormal menstrual periods.  · Increasing pain with menstrual periods.  How is this diagnosed?  These cysts are commonly found during a routine pelvic exam. You may have tests to find out more about the cyst, such as:  · Ultrasound.  · X-ray of the pelvis.  · CT scan.  · MRI.  · Blood tests.  How is this treated?  Many ovarian cysts go away on their own without treatment. Your health care provider may want to check your cyst regularly for 2-3 months to see if it changes. If you are in menopause, it is especially important to have your cyst monitored closely because menopausal women have a higher rate of ovarian cancer.  When treatment is needed, it may include:  · Medicines to help relieve pain.  · A procedure to drain the cyst (aspiration).  · Surgery to remove the whole cyst.  · Hormone treatment or birth control pills. These methods are sometimes used   to help dissolve a cyst.  Follow these instructions at home:  · Take over-the-counter and prescription medicines only as told by your health care provider.  · Do not drive or use heavy machinery while taking prescription pain medicine.  · Get regular pelvic exams and Pap tests as often as told by your health care provider.  · Return to your normal activities as told by your health care provider. Ask your health care provider what activities are safe for you.  · Do not use any products that contain nicotine or tobacco, such as cigarettes and e-cigarettes. If you need help quitting, ask your health care provider.  · Keep all follow-up visits as told by your health care provider.  This is important.  Contact a health care provider if:  · Your periods are late, irregular, or painful, or they stop.  · You have pelvic pain that does not go away.  · You have pressure on your bladder or trouble emptying your bladder completely.  · You have pain during sex.  · You have any of the following in your abdomen:  ? A feeling of fullness.  ? Pressure.  ? Discomfort.  ? Pain that does not go away.  ? Swelling.  · You feel generally ill.  · You become constipated.  · You lose your appetite.  · You develop severe acne.  · You start to have more body hair and facial hair.  · You are gaining weight or losing weight without changing your exercise and eating habits.  · You think you may be pregnant.  Get help right away if:  · You have abdominal pain that is severe or gets worse.  · You cannot eat or drink without vomiting.  · You suddenly develop a fever.  · Your menstrual period is much heavier than usual.  This information is not intended to replace advice given to you by your health care provider. Make sure you discuss any questions you have with your health care provider.  Document Released: 03/27/2005 Document Revised: 10/15/2015 Document Reviewed: 08/29/2015  Elsevier Interactive Patient Education © 2019 [employer]

## 2018-04-26 LAB — TSH: TSH: 1.22 mIU/L

## 2018-04-26 LAB — CA 125: CA 125: 8 U/mL (ref <35)

## 2018-05-08 DIAGNOSIS — J309 Allergic rhinitis, unspecified: Secondary | ICD-10-CM | POA: Diagnosis not present

## 2018-05-09 ENCOUNTER — Other Ambulatory Visit: Payer: Self-pay | Admitting: Women's Health

## 2018-05-29 NOTE — Telephone Encounter (Signed)
Phone call, has stopped Zoloft completely and is on 37.5 of the Effexor which he thinks is helping, will increase to 75 mg daily.  Reviewed importance of leisure activities, self-care and regular exercise.  Instructed to call if no relief of symptoms with the Effexor 75 mg.

## 2018-06-05 DIAGNOSIS — J309 Allergic rhinitis, unspecified: Secondary | ICD-10-CM | POA: Diagnosis not present

## 2018-06-10 DIAGNOSIS — M542 Cervicalgia: Secondary | ICD-10-CM | POA: Diagnosis not present

## 2018-06-10 DIAGNOSIS — G518 Other disorders of facial nerve: Secondary | ICD-10-CM | POA: Diagnosis not present

## 2018-06-10 DIAGNOSIS — M791 Myalgia, unspecified site: Secondary | ICD-10-CM | POA: Diagnosis not present

## 2018-06-10 DIAGNOSIS — G43719 Chronic migraine without aura, intractable, without status migrainosus: Secondary | ICD-10-CM | POA: Diagnosis not present

## 2018-06-25 DIAGNOSIS — E119 Type 2 diabetes mellitus without complications: Secondary | ICD-10-CM | POA: Diagnosis not present

## 2018-06-25 DIAGNOSIS — Z794 Long term (current) use of insulin: Secondary | ICD-10-CM | POA: Diagnosis not present

## 2018-06-28 ENCOUNTER — Encounter: Payer: Self-pay | Admitting: Women's Health

## 2018-06-28 DIAGNOSIS — R6889 Other general symptoms and signs: Secondary | ICD-10-CM | POA: Diagnosis not present

## 2018-06-28 DIAGNOSIS — J029 Acute pharyngitis, unspecified: Secondary | ICD-10-CM | POA: Diagnosis not present

## 2018-06-28 DIAGNOSIS — J019 Acute sinusitis, unspecified: Secondary | ICD-10-CM | POA: Diagnosis not present

## 2018-06-28 DIAGNOSIS — B9689 Other specified bacterial agents as the cause of diseases classified elsewhere: Secondary | ICD-10-CM | POA: Diagnosis not present

## 2018-07-01 ENCOUNTER — Other Ambulatory Visit: Payer: Self-pay | Admitting: Women's Health

## 2018-07-01 MED ORDER — VENLAFAXINE HCL ER 37.5 MG PO CP24: ORAL_CAPSULE | ORAL | 3 refills | Status: DC

## 2018-07-01 NOTE — Telephone Encounter (Signed)
You spoke with patient in Feb.  "Phone call, has stopped Zoloft completely and is on 37.5 of the Effexor which he thinks is helping, will increase to 75 mg daily.  Reviewed importance of leisure activities, self-care and regular exercise.  Instructed to call if no relief of symptoms with the Effexor 75 mg."  Okay to send in new Rx for Effexor XR 75 mg daily?

## 2018-08-14 DIAGNOSIS — G43719 Chronic migraine without aura, intractable, without status migrainosus: Secondary | ICD-10-CM | POA: Diagnosis not present

## 2018-08-14 DIAGNOSIS — G518 Other disorders of facial nerve: Secondary | ICD-10-CM | POA: Diagnosis not present

## 2018-08-14 DIAGNOSIS — M791 Myalgia, unspecified site: Secondary | ICD-10-CM | POA: Diagnosis not present

## 2018-08-14 DIAGNOSIS — M542 Cervicalgia: Secondary | ICD-10-CM | POA: Diagnosis not present

## 2018-08-22 ENCOUNTER — Other Ambulatory Visit: Payer: Self-pay | Admitting: Women's Health

## 2018-08-26 NOTE — Telephone Encounter (Signed)
Called into pharmacy

## 2018-08-26 NOTE — Telephone Encounter (Signed)
OK for refill.

## 2018-09-19 DIAGNOSIS — Z1231 Encounter for screening mammogram for malignant neoplasm of breast: Secondary | ICD-10-CM | POA: Diagnosis not present

## 2018-10-07 ENCOUNTER — Other Ambulatory Visit: Payer: Self-pay | Admitting: Women's Health

## 2018-10-08 NOTE — Telephone Encounter (Signed)
I thought it looked like in May that we called in #30 with one refill but I called pharmacy and they said it was for #30 with no refills.  So she has had #30 since 08/26/18.

## 2018-10-08 NOTE — Telephone Encounter (Signed)
Okay for refill, thank you for being so thorough

## 2018-10-08 NOTE — Telephone Encounter (Signed)
Called into pharmacy

## 2018-10-11 ENCOUNTER — Other Ambulatory Visit: Payer: Self-pay

## 2018-10-23 ENCOUNTER — Other Ambulatory Visit: Payer: Self-pay | Admitting: Women's Health

## 2018-10-23 DIAGNOSIS — B009 Herpesviral infection, unspecified: Secondary | ICD-10-CM

## 2018-10-30 DIAGNOSIS — G43719 Chronic migraine without aura, intractable, without status migrainosus: Secondary | ICD-10-CM | POA: Diagnosis not present

## 2018-10-30 DIAGNOSIS — M542 Cervicalgia: Secondary | ICD-10-CM | POA: Diagnosis not present

## 2018-11-26 ENCOUNTER — Other Ambulatory Visit: Payer: Self-pay

## 2018-11-26 ENCOUNTER — Encounter: Payer: Self-pay | Admitting: Women's Health

## 2018-11-26 ENCOUNTER — Ambulatory Visit: Payer: BC Managed Care – PPO | Admitting: Women's Health

## 2018-11-26 DIAGNOSIS — F418 Other specified anxiety disorders: Secondary | ICD-10-CM

## 2018-11-26 DIAGNOSIS — Z79890 Hormone replacement therapy (postmenopausal): Secondary | ICD-10-CM

## 2018-11-26 DIAGNOSIS — Z01419 Encounter for gynecological examination (general) (routine) without abnormal findings: Secondary | ICD-10-CM

## 2018-11-26 DIAGNOSIS — B009 Herpesviral infection, unspecified: Secondary | ICD-10-CM

## 2018-11-26 MED ORDER — VENLAFAXINE HCL ER 37.5 MG PO CP24: ORAL_CAPSULE | ORAL | 3 refills | Status: DC

## 2018-11-26 MED ORDER — NORETHINDRONE-ETH ESTRADIOL 1-5 MG-MCG PO TABS: 1.0000 | ORAL_TABLET | Freq: Every day | ORAL | 4 refills | Status: DC

## 2018-11-26 MED ORDER — ALPRAZOLAM 0.25 MG PO TABS: 0.2500 mg | ORAL_TABLET | Freq: Every evening | ORAL | 1 refills | Status: DC | PRN

## 2018-11-26 MED ORDER — VENLAFAXINE HCL ER 75 MG PO CP24: 75.0000 mg | ORAL_CAPSULE | Freq: Every day | ORAL | 4 refills | Status: DC

## 2018-11-26 MED ORDER — VALACYCLOVIR HCL 500 MG PO TABS: 500.0000 mg | ORAL_TABLET | Freq: Two times a day (BID) | ORAL | 4 refills | Status: AC

## 2018-11-26 NOTE — Progress Notes (Signed)
Alexandria Wheeler 08/14/1960 [id number]    History:    Presents for annual exam.  Postmenopausal on HRT with no bleeding.  Continues with hot flushes.  Also is on Effexor which has helped anxiety and depression, counseling in the past.  Normal Pap and mammogram history.  2013 had a negative breast biopsy.  2016- colonoscopy.  Diabetes on insulin endocrinologist manages.  Has had Pneumovax.  HSV rare outbreaks.  Past medical history, past surgical history, family history and social history were all reviewed and documented in the EPIC chart.  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  1 stepdaughter doing well.  ROS:  A ROS was performed and pertinent positives and negatives are included.  Exam:  Vitals:   11/26/18 0901  BP: 128/80  Weight: 181 lb (82.1 kg)  Height: 5\' 6"  (1.676 m)   Body mass index is 29.21 kg/m.   General appearance:  Normal Thyroid:  Symmetrical, normal in size, without palpable masses or nodularity. Respiratory  Auscultation:  Clear without wheezing or rhonchi Cardiovascular  Auscultation:  Regular rate, without rubs, murmurs or gallops  Edema/varicosities:  Not grossly evident Abdominal  Soft,nontender, without masses, guarding or rebound.  Liver/spleen:  No organomegaly noted  Hernia:  None appreciated  Skin  Inspection:  Grossly normal   Breasts: Examined lying and sitting.     Right: Without masses, retractions, discharge or axillary adenopathy.     Left: Without masses, retractions, discharge or axillary adenopathy. Gentitourinary   Inguinal/mons:  Normal without inguinal adenopathy  External genitalia:  Normal  BUS/Urethra/Skene's glands:  Normal  Vagina:  Normal  Cervix:  Normal  Uterus:  normal in size, shape and contour.  Midline and mobile  Adnexa/parametria:     Rt: Without masses or tenderness.   Lt: Without masses or tenderness.  Anus and perineum: Normal  Digital rectal exam: Normal sphincter tone without palpated masses or  tenderness  Assessment/Plan:  58 y.o. MWF G0 for annual exam.    Postmenopausal on HRT with no bleeding with hot flushes Diabetes on insulin endocrinologist manages Anxiety/depression stable on Effexor History of migraines and Bell's palsy  Plan: FEMHRT 1/5 prescription, proper use given and reviewed slight risk for blood clots and strokes.  Reviewed best to be on less than 7 years women's health initiative discussed.  Effexor 75 mg prescription, proper use given and reviewed.  Reviewed importance of self-care, leisure activities, regular exercise.  Xanax 0.25 aware to use sparingly addictive properties discussed.  SBEs, continue annual screening mammogram, calcium rich foods, vitamin D 2000 daily encouraged.  Pap normal 2018, new screening guidelines reviewed.Huel Cote [facility], 9:34 AM 11/26/2018

## 2018-11-26 NOTE — Patient Instructions (Signed)

## 2018-12-06 ENCOUNTER — Other Ambulatory Visit: Payer: Self-pay | Admitting: Women's Health

## 2018-12-06 NOTE — Telephone Encounter (Signed)
ok 

## 2018-12-11 DIAGNOSIS — M542 Cervicalgia: Secondary | ICD-10-CM | POA: Diagnosis not present

## 2018-12-11 DIAGNOSIS — G43719 Chronic migraine without aura, intractable, without status migrainosus: Secondary | ICD-10-CM | POA: Diagnosis not present

## 2019-01-09 ENCOUNTER — Other Ambulatory Visit: Payer: Self-pay | Admitting: Women's Health

## 2019-01-09 DIAGNOSIS — Z79890 Hormone replacement therapy (postmenopausal): Secondary | ICD-10-CM

## 2019-01-09 NOTE — Telephone Encounter (Signed)
Ok for refills

## 2019-01-09 NOTE — Telephone Encounter (Signed)
Alexandria Wheeler I will approve Femhrt 1/5mg , she had annual exam on 11/26/18. But she is also requesting refill on Zofran 4 mg tablet

## 2019-01-11 ENCOUNTER — Telehealth: Payer: Self-pay | Admitting: Internal Medicine

## 2019-01-11 NOTE — Telephone Encounter (Signed)
error 

## 2019-01-29 DIAGNOSIS — E119 Type 2 diabetes mellitus without complications: Secondary | ICD-10-CM | POA: Diagnosis not present

## 2019-01-29 DIAGNOSIS — Z794 Long term (current) use of insulin: Secondary | ICD-10-CM | POA: Diagnosis not present

## 2019-01-29 DIAGNOSIS — Z9189 Other specified personal risk factors, not elsewhere classified: Secondary | ICD-10-CM | POA: Diagnosis not present

## 2019-02-14 DIAGNOSIS — J019 Acute sinusitis, unspecified: Secondary | ICD-10-CM | POA: Diagnosis not present

## 2019-02-14 DIAGNOSIS — Z03818 Encounter for observation for suspected exposure to other biological agents ruled out: Secondary | ICD-10-CM | POA: Diagnosis not present

## 2019-02-14 DIAGNOSIS — J302 Other seasonal allergic rhinitis: Secondary | ICD-10-CM | POA: Diagnosis not present

## 2019-02-14 DIAGNOSIS — H66001 Acute suppurative otitis media without spontaneous rupture of ear drum, right ear: Secondary | ICD-10-CM | POA: Diagnosis not present

## 2019-02-19 DIAGNOSIS — G43719 Chronic migraine without aura, intractable, without status migrainosus: Secondary | ICD-10-CM | POA: Diagnosis not present

## 2019-02-19 DIAGNOSIS — M542 Cervicalgia: Secondary | ICD-10-CM | POA: Diagnosis not present

## 2019-07-14 MED ORDER — ALPRAZOLAM 0.25 MG PO TABS: 0.2500 mg | ORAL_TABLET | Freq: Every evening | ORAL | 0 refills | Status: AC | PRN

## 2019-07-14 NOTE — Telephone Encounter (Signed)
Okay to print and I will sign

## 2019-07-14 NOTE — Telephone Encounter (Signed)
If okay with you you can sign it and it is set to print. Thanks

## 2020-03-01 ENCOUNTER — Telehealth: Payer: Self-pay | Admitting: *Deleted

## 2020-03-01 MED ORDER — VENLAFAXINE HCL ER 75 MG PO CP24: 75.0000 mg | ORAL_CAPSULE | Freq: Every day | ORAL | 0 refills | Status: AC

## 2020-03-01 NOTE — Telephone Encounter (Signed)
Patient has moved to [facility] and has a new GYN appointment scheduled on 03/25/20. She is completely out of Effexor 75 mg tablet. She asked if 1 month supply could be sent pharmacy? If okay I will get the pharmacy in [facility] from patient to send Rx. Please advise

## 2020-03-01 NOTE — Telephone Encounter (Signed)
Yes that is fine.  Thank you.

## 2020-03-01 NOTE — Telephone Encounter (Signed)
Patient informed, Rx sent.  

## 2020-03-31 ENCOUNTER — Other Ambulatory Visit: Payer: Self-pay | Admitting: *Deleted

## 2020-06-08 ENCOUNTER — Telehealth: Payer: Self-pay

## 2020-06-08 NOTE — Telephone Encounter (Signed)
erro  neous encounter

## 2020-09-27 ENCOUNTER — Other Ambulatory Visit: Payer: Self-pay | Admitting: Nurse Practitioner
